# Patient Record
Sex: Female | Born: 1955 | Hispanic: Yes | Smoking: Never smoker
Health system: Southern US, Community
[De-identification: ages and names within clinical notes are randomized; demographics above are authoritative.]

## PROBLEM LIST (undated history)

## (undated) DIAGNOSIS — E041 Nontoxic single thyroid nodule: Secondary | ICD-10-CM

## (undated) DIAGNOSIS — E785 Hyperlipidemia, unspecified: Secondary | ICD-10-CM

## (undated) DIAGNOSIS — E079 Disorder of thyroid, unspecified: Secondary | ICD-10-CM

## (undated) HISTORY — DX: Nontoxic single thyroid nodule: E04.1

## (undated) HISTORY — DX: Hyperlipidemia, unspecified: E78.5

## (undated) HISTORY — DX: Disorder of thyroid, unspecified: E07.9

---

## 2002-09-27 HISTORY — PX: BREAST BIOPSY: SHX20

## 2005-12-21 ENCOUNTER — Ambulatory Visit: Payer: Self-pay

## 2007-02-22 ENCOUNTER — Ambulatory Visit: Payer: Self-pay | Admitting: Internal Medicine

## 2007-02-23 ENCOUNTER — Ambulatory Visit: Payer: Self-pay | Admitting: Internal Medicine

## 2007-03-03 ENCOUNTER — Ambulatory Visit: Payer: Self-pay | Admitting: Internal Medicine

## 2007-04-20 ENCOUNTER — Ambulatory Visit: Payer: Self-pay | Admitting: Internal Medicine

## 2007-10-09 ENCOUNTER — Ambulatory Visit: Payer: Self-pay | Admitting: Internal Medicine

## 2007-12-29 ENCOUNTER — Ambulatory Visit: Payer: Self-pay | Admitting: Internal Medicine

## 2008-02-27 ENCOUNTER — Ambulatory Visit: Payer: Self-pay | Admitting: Internal Medicine

## 2008-03-29 ENCOUNTER — Ambulatory Visit: Payer: Self-pay | Admitting: Internal Medicine

## 2008-05-29 ENCOUNTER — Ambulatory Visit: Payer: Self-pay | Admitting: Internal Medicine

## 2008-09-27 ENCOUNTER — Inpatient Hospital Stay: Payer: Self-pay | Admitting: Psychiatry

## 2008-10-05 ENCOUNTER — Ambulatory Visit: Payer: Self-pay | Admitting: Internal Medicine

## 2009-01-25 ENCOUNTER — Ambulatory Visit: Payer: Self-pay | Admitting: Internal Medicine

## 2009-04-02 ENCOUNTER — Ambulatory Visit: Payer: Self-pay | Admitting: Internal Medicine

## 2009-07-11 ENCOUNTER — Other Ambulatory Visit: Payer: Self-pay | Admitting: Internal Medicine

## 2009-12-08 ENCOUNTER — Other Ambulatory Visit: Payer: Self-pay | Admitting: Internal Medicine

## 2010-02-04 ENCOUNTER — Ambulatory Visit: Payer: Self-pay | Admitting: Internal Medicine

## 2010-02-13 ENCOUNTER — Ambulatory Visit: Payer: Self-pay | Admitting: General Practice

## 2010-04-07 ENCOUNTER — Ambulatory Visit: Payer: Self-pay

## 2010-05-16 ENCOUNTER — Emergency Department: Payer: Self-pay | Admitting: Emergency Medicine

## 2011-01-29 ENCOUNTER — Other Ambulatory Visit: Payer: Self-pay | Admitting: Internal Medicine

## 2011-04-20 ENCOUNTER — Other Ambulatory Visit: Payer: Self-pay | Admitting: Internal Medicine

## 2011-06-03 ENCOUNTER — Other Ambulatory Visit: Payer: Self-pay | Admitting: Internal Medicine

## 2011-06-17 ENCOUNTER — Other Ambulatory Visit: Payer: Self-pay | Admitting: Internal Medicine

## 2011-06-18 MED ORDER — LEVOTHYROXINE SODIUM 50 MCG PO TABS: 50.0000 ug | ORAL_TABLET | Freq: Every day | ORAL | Status: DC

## 2011-06-29 ENCOUNTER — Ambulatory Visit: Payer: Self-pay | Admitting: Internal Medicine

## 2011-09-27 ENCOUNTER — Other Ambulatory Visit: Payer: Self-pay | Admitting: General Practice

## 2011-12-06 DIAGNOSIS — E039 Hypothyroidism, unspecified: Secondary | ICD-10-CM | POA: Insufficient documentation

## 2011-12-06 DIAGNOSIS — E782 Mixed hyperlipidemia: Secondary | ICD-10-CM | POA: Insufficient documentation

## 2011-12-07 DIAGNOSIS — N6009 Solitary cyst of unspecified breast: Secondary | ICD-10-CM | POA: Insufficient documentation

## 2011-12-13 ENCOUNTER — Other Ambulatory Visit: Payer: Self-pay | Admitting: Internal Medicine

## 2011-12-13 ENCOUNTER — Ambulatory Visit: Payer: Self-pay | Admitting: Internal Medicine

## 2011-12-13 LAB — COMPREHENSIVE METABOLIC PANEL
Albumin: 3.6 g/dL (ref 3.4–5.0)
Alkaline Phosphatase: 84 U/L (ref 50–136)
Anion Gap: 11 (ref 7–16)
BUN: 14 mg/dL (ref 7–18)
Bilirubin,Total: 0.5 mg/dL (ref 0.2–1.0)
Calcium, Total: 8.9 mg/dL (ref 8.5–10.1)
Chloride: 104 mmol/L (ref 98–107)
Co2: 27 mmol/L (ref 21–32)
Creatinine: 0.73 mg/dL (ref 0.60–1.30)
EGFR (African American): >60
EGFR (Non-African Amer.): >60
Glucose: 92 mg/dL (ref 65–99)
Osmolality: 283 (ref 275–301)
Potassium: 3.7 mmol/L (ref 3.5–5.1)
SGOT(AST): 35 U/L (ref 15–37)
SGPT (ALT): 27 U/L
Sodium: 142 mmol/L (ref 136–145)
Total Protein: 7.4 g/dL (ref 6.4–8.2)

## 2011-12-13 LAB — TSH: Thyroid Stimulating Horm: 3.83 u[IU]/mL

## 2011-12-13 LAB — LIPID PANEL
Cholesterol: 185 mg/dL (ref 0–200)
HDL Cholesterol: 36 mg/dL — ABNORMAL LOW (ref 40–60)
Ldl Cholesterol, Calc: 116 mg/dL — ABNORMAL HIGH (ref 0–100)
Triglycerides: 163 mg/dL (ref 0–200)
VLDL Cholesterol, Calc: 33 mg/dL (ref 5–40)

## 2011-12-27 ENCOUNTER — Ambulatory Visit: Payer: Self-pay | Admitting: Surgery

## 2011-12-27 LAB — CBC WITH DIFFERENTIAL/PLATELET
Basophil #: 0 10*3/uL (ref 0.0–0.1)
Basophil %: 0.6 %
Eosinophil #: 0.1 10*3/uL (ref 0.0–0.7)
Eosinophil %: 1.4 %
HCT: 38 % (ref 35.0–47.0)
HGB: 12.9 g/dL (ref 12.0–16.0)
Lymphocyte #: 1.7 10*3/uL (ref 1.0–3.6)
Lymphocyte %: 34.3 %
MCH: 30.8 pg (ref 26.0–34.0)
MCHC: 34 g/dL (ref 32.0–36.0)
MCV: 91 fL (ref 80–100)
Monocyte #: 0.2 10*3/uL (ref 0.0–0.7)
Monocyte %: 4.5 %
Neutrophil #: 2.9 10*3/uL (ref 1.4–6.5)
Neutrophil %: 59.2 %
Platelet: 199 10*3/uL (ref 150–440)
RBC: 4.19 10*6/uL (ref 3.80–5.20)
RDW: 13.2 % (ref 11.5–14.5)
WBC: 4.9 10*3/uL (ref 3.6–11.0)

## 2012-01-03 ENCOUNTER — Ambulatory Visit: Payer: Self-pay | Admitting: Surgery

## 2012-01-03 HISTORY — PX: BREAST EXCISIONAL BIOPSY: SUR124

## 2012-01-06 LAB — PATHOLOGY REPORT

## 2012-06-14 ENCOUNTER — Ambulatory Visit: Payer: Self-pay | Admitting: Surgery

## 2012-06-14 LAB — CBC WITH DIFFERENTIAL/PLATELET
Basophil #: 0 10*3/uL (ref 0.0–0.1)
Basophil %: 0.4 %
Eosinophil #: 0.1 10*3/uL (ref 0.0–0.7)
Eosinophil %: 0.6 %
HCT: 37.4 % (ref 35.0–47.0)
HGB: 12.4 g/dL (ref 12.0–16.0)
Lymphocyte #: 1.7 10*3/uL (ref 1.0–3.6)
Lymphocyte %: 19.8 %
MCH: 29.7 pg (ref 26.0–34.0)
MCHC: 33 g/dL (ref 32.0–36.0)
MCV: 90 fL (ref 80–100)
Monocyte #: 0.3 x10 3/mm (ref 0.2–0.9)
Monocyte %: 3.7 %
Neutrophil #: 6.3 10*3/uL (ref 1.4–6.5)
Neutrophil %: 75.5 %
Platelet: 240 10*3/uL (ref 150–440)
RBC: 4.17 10*6/uL (ref 3.80–5.20)
RDW: 13.3 % (ref 11.5–14.5)
WBC: 8.4 10*3/uL (ref 3.6–11.0)

## 2012-06-14 LAB — BASIC METABOLIC PANEL
Anion Gap: 9 (ref 7–16)
BUN: 26 mg/dL — ABNORMAL HIGH (ref 7–18)
Calcium, Total: 8.7 mg/dL (ref 8.5–10.1)
Chloride: 105 mmol/L (ref 98–107)
Co2: 25 mmol/L (ref 21–32)
Creatinine: 1.14 mg/dL (ref 0.60–1.30)
EGFR (African American): >60
EGFR (Non-African Amer.): 53 — ABNORMAL LOW
Glucose: 104 mg/dL — ABNORMAL HIGH (ref 65–99)
Osmolality: 283 (ref 275–301)
Potassium: 3.8 mmol/L (ref 3.5–5.1)
Sodium: 139 mmol/L (ref 136–145)

## 2012-06-22 ENCOUNTER — Ambulatory Visit: Payer: Self-pay | Admitting: Surgery

## 2012-06-22 HISTORY — PX: BREAST EXCISIONAL BIOPSY: SUR124

## 2012-06-26 LAB — PATHOLOGY REPORT

## 2012-07-24 ENCOUNTER — Ambulatory Visit: Payer: Self-pay | Admitting: Internal Medicine

## 2012-07-24 DIAGNOSIS — IMO0001 Reserved for inherently not codable concepts without codable children: Secondary | ICD-10-CM | POA: Insufficient documentation

## 2012-07-24 LAB — CBC WITH DIFFERENTIAL/PLATELET
Basophil #: 0 10*3/uL (ref 0.0–0.1)
Basophil %: 0.4 %
Eosinophil #: 0.1 10*3/uL (ref 0.0–0.7)
Eosinophil %: 1.4 %
HCT: 39.2 % (ref 35.0–47.0)
HGB: 13.4 g/dL (ref 12.0–16.0)
Lymphocyte #: 2 10*3/uL (ref 1.0–3.6)
Lymphocyte %: 37.6 %
MCH: 30.5 pg (ref 26.0–34.0)
MCHC: 34.2 g/dL (ref 32.0–36.0)
MCV: 89 fL (ref 80–100)
Monocyte #: 0.2 x10 3/mm (ref 0.2–0.9)
Monocyte %: 4 %
Neutrophil #: 3.1 10*3/uL (ref 1.4–6.5)
Neutrophil %: 56.6 %
Platelet: 223 10*3/uL (ref 150–440)
RBC: 4.39 10*6/uL (ref 3.80–5.20)
RDW: 13 % (ref 11.5–14.5)
WBC: 5.4 10*3/uL (ref 3.6–11.0)

## 2012-07-24 LAB — COMPREHENSIVE METABOLIC PANEL
Albumin: 3.5 g/dL (ref 3.4–5.0)
Alkaline Phosphatase: 101 U/L (ref 50–136)
Anion Gap: 6 — ABNORMAL LOW (ref 7–16)
BUN: 19 mg/dL — ABNORMAL HIGH (ref 7–18)
Bilirubin,Total: 0.4 mg/dL (ref 0.2–1.0)
Calcium, Total: 8.7 mg/dL (ref 8.5–10.1)
Chloride: 108 mmol/L — ABNORMAL HIGH (ref 98–107)
Co2: 29 mmol/L (ref 21–32)
Creatinine: 0.72 mg/dL (ref 0.60–1.30)
EGFR (African American): >60
EGFR (Non-African Amer.): >60
Glucose: 86 mg/dL (ref 65–99)
Osmolality: 287 (ref 275–301)
Potassium: 3.8 mmol/L (ref 3.5–5.1)
SGOT(AST): 31 U/L (ref 15–37)
SGPT (ALT): 27 U/L (ref 12–78)
Sodium: 143 mmol/L (ref 136–145)
Total Protein: 7.2 g/dL (ref 6.4–8.2)

## 2012-08-30 ENCOUNTER — Ambulatory Visit: Payer: Self-pay | Admitting: General Practice

## 2012-09-09 ENCOUNTER — Ambulatory Visit: Payer: Self-pay | Admitting: Unknown Physician Specialty

## 2012-09-09 DIAGNOSIS — Z9889 Other specified postprocedural states: Secondary | ICD-10-CM | POA: Insufficient documentation

## 2012-09-13 LAB — PATHOLOGY REPORT

## 2012-09-21 HISTORY — PX: OTHER SURGICAL HISTORY: SHX169

## 2012-10-30 ENCOUNTER — Ambulatory Visit: Payer: Self-pay | Admitting: Surgery

## 2013-02-23 ENCOUNTER — Other Ambulatory Visit: Payer: Self-pay | Admitting: Family Medicine

## 2013-02-23 LAB — LIPID PANEL
Cholesterol: 149 mg/dL (ref 0–200)
HDL Cholesterol: 29 mg/dL — ABNORMAL LOW (ref 40–60)
Ldl Cholesterol, Calc: 82 mg/dL (ref 0–100)
Triglycerides: 188 mg/dL (ref 0–200)
VLDL Cholesterol, Calc: 38 mg/dL (ref 5–40)

## 2013-02-23 LAB — TSH: Thyroid Stimulating Horm: 6.6 u[IU]/mL — ABNORMAL HIGH

## 2013-05-28 ENCOUNTER — Other Ambulatory Visit: Payer: Self-pay | Admitting: Family Medicine

## 2013-05-28 LAB — TSH: Thyroid Stimulating Horm: 2.04 u[IU]/mL

## 2013-09-08 LAB — HM COLONOSCOPY

## 2013-10-15 ENCOUNTER — Ambulatory Visit: Payer: Self-pay | Admitting: Otolaryngology

## 2013-10-22 ENCOUNTER — Other Ambulatory Visit: Payer: Self-pay | Admitting: Family Medicine

## 2013-10-22 LAB — LIPID PANEL
Cholesterol: 170 mg/dL (ref 0–200)
HDL Cholesterol: 32 mg/dL — ABNORMAL LOW (ref 40–60)
Ldl Cholesterol, Calc: 111 mg/dL — ABNORMAL HIGH (ref 0–100)
Triglycerides: 136 mg/dL (ref 0–200)
VLDL Cholesterol, Calc: 27 mg/dL (ref 5–40)

## 2013-10-22 LAB — TSH: Thyroid Stimulating Horm: 2.59 u[IU]/mL

## 2013-11-05 ENCOUNTER — Ambulatory Visit: Payer: Self-pay | Admitting: Family Medicine

## 2013-11-30 ENCOUNTER — Ambulatory Visit: Payer: Self-pay | Admitting: Family Medicine

## 2013-12-31 ENCOUNTER — Ambulatory Visit: Payer: Self-pay | Admitting: Family Medicine

## 2013-12-31 ENCOUNTER — Ambulatory Visit: Payer: Self-pay

## 2014-01-25 ENCOUNTER — Ambulatory Visit: Payer: Self-pay

## 2014-09-02 DIAGNOSIS — M19049 Primary osteoarthritis, unspecified hand: Secondary | ICD-10-CM | POA: Insufficient documentation

## 2014-09-06 ENCOUNTER — Ambulatory Visit: Payer: Self-pay | Admitting: Family Medicine

## 2014-09-06 LAB — COMPREHENSIVE METABOLIC PANEL
Albumin: 3.2 g/dL — ABNORMAL LOW (ref 3.4–5.0)
Alkaline Phosphatase: 116 U/L
Anion Gap: 4 — ABNORMAL LOW (ref 7–16)
BUN: 18 mg/dL (ref 7–18)
Bilirubin,Total: 0.6 mg/dL (ref 0.2–1.0)
Calcium, Total: 8.3 mg/dL — ABNORMAL LOW (ref 8.5–10.1)
Chloride: 110 mmol/L — ABNORMAL HIGH (ref 98–107)
Co2: 28 mmol/L (ref 21–32)
Creatinine: 0.76 mg/dL (ref 0.60–1.30)
EGFR (African American): >60
EGFR (Non-African Amer.): >60
Glucose: 89 mg/dL (ref 65–99)
Osmolality: 284 (ref 275–301)
Potassium: 4 mmol/L (ref 3.5–5.1)
SGOT(AST): 26 U/L (ref 15–37)
SGPT (ALT): 28 U/L
Sodium: 142 mmol/L (ref 136–145)
Total Protein: 7 g/dL (ref 6.4–8.2)

## 2014-09-06 LAB — CBC WITH DIFFERENTIAL/PLATELET
Basophil #: 0 10*3/uL (ref 0.0–0.1)
Basophil %: 0.5 %
Eosinophil #: 0.1 10*3/uL (ref 0.0–0.7)
Eosinophil %: 1.4 %
HCT: 40.9 % (ref 35.0–47.0)
HGB: 13.2 g/dL (ref 12.0–16.0)
Lymphocyte #: 1.8 10*3/uL (ref 1.0–3.6)
Lymphocyte %: 32.3 %
MCH: 29.3 pg (ref 26.0–34.0)
MCHC: 32.2 g/dL (ref 32.0–36.0)
MCV: 91 fL (ref 80–100)
Monocyte #: 0.2 x10 3/mm (ref 0.2–0.9)
Monocyte %: 4.2 %
Neutrophil #: 3.5 10*3/uL (ref 1.4–6.5)
Neutrophil %: 61.6 %
Platelet: 228 10*3/uL (ref 150–440)
RBC: 4.49 10*6/uL (ref 3.80–5.20)
RDW: 14.5 % (ref 11.5–14.5)
WBC: 5.6 10*3/uL (ref 3.6–11.0)

## 2014-09-06 LAB — LIPID PANEL
Cholesterol: 159 mg/dL (ref 0–200)
HDL Cholesterol: 39 mg/dL — ABNORMAL LOW (ref 40–60)
Ldl Cholesterol, Calc: 94 mg/dL (ref 0–100)
Triglycerides: 129 mg/dL (ref 0–200)
VLDL Cholesterol, Calc: 26 mg/dL (ref 5–40)

## 2014-09-06 LAB — T4, FREE: Free Thyroxine: 0.71 ng/dL — ABNORMAL LOW (ref 0.76–1.46)

## 2014-09-06 LAB — HEMOGLOBIN A1C: Hemoglobin A1C: 5.7 % (ref 4.2–6.3)

## 2014-09-06 LAB — TSH: Thyroid Stimulating Horm: 2.1 u[IU]/mL

## 2014-12-18 ENCOUNTER — Ambulatory Visit: Payer: Self-pay | Admitting: Family Medicine

## 2015-01-14 NOTE — Op Note (Signed)
PATIENT NAME:  Ashley BasemanYUQUILIMA AGUDO, Har[place] G MR#:  [id number] DATE OF BIRTH:  04-Nov-1955  DATE OF PROCEDURE:  06/22/2012  PREOPERATIVE DIAGNOSIS: Phyllodes tumor of the right breast.   POSTOPERATIVE DIAGNOSIS: Phyllodes tumor of the right breast.   PROCEDURE: Re-excision of right breast mass.   SURGEON: Adella HareJ. Wilton Jeramie Scogin, MD  ANESTHESIA: General.   INDICATIONS: This 59 year old female had excision of a mass of the upper outer quadrant right breast which was a phyllodes tumor. There was some involvement of the margins. Re-excision was recommended for further treatment.   DESCRIPTION OF PROCEDURE: The patient was placed on the operating table in the supine position under general anesthesia. The right arm was placed on a lateral arm support. The right breast was prepared with ChloraPrep, draped in a sterile manner.   The old scar was identified in the upper outer quadrant of the right breast. The new incision was made to excise the scar with an ellipse of approximately 8 mm in width and the elliptical excision was carried out and dissected down through subcutaneous tissues palpating some scar tissue from her previous surgery and excised the tissue with use of electrocautery for hemostasis. A portion of tissue which was approximately 5 x 3 cm in dimension and extended down to the deep fascia was excised. The incision went from the 9:00 to 11:00 position. The 11:00 position was tagged with a stitch for the pathologist's orientation. There was no actual tumor identified in the cut margins. A portion of the tissue was the dense breast glandular tissue and was submitted in formalin for routine pathology. The wound was inspected Three clamped vessels were suture ligated with 3-0 chromic. Several other small bleeding points were cauterized. Hemostasis was subsequently intact. Tissues were infiltrated with 0.25% Sensorcaine with epinephrine and the subcutaneous tissues were closed with 3-0 chromic and then the skin was  closed with running 4-0 Monocryl subcuticular suture and Dermabond.    The patient tolerated surgery satisfactorily and was then prepared for transfer to the recovery room.  ____________________________ Shela CommonsJ. Renda RollsWilton Lavone Weisel, MD jws:cms D: 06/22/2012 08:48:27 ET T: 06/22/2012 10:46:59 ET JOB#: [id number]  cc: Adella HareJ. Wilton Kaid Seeberger, MD, <Dictator> Adella HareWILTON J Vanessa Kampf MD ELECTRONICALLY SIGNED 06/23/2012 9:45

## 2015-04-17 DIAGNOSIS — R7303 Prediabetes: Secondary | ICD-10-CM | POA: Insufficient documentation

## 2015-04-22 ENCOUNTER — Other Ambulatory Visit
Admission: RE | Admit: 2015-04-22 | Discharge: 2015-04-22 | Disposition: A | Discharge status: Home / Self Care | Payer: 59 | Source: Ambulatory Visit | Attending: Family Medicine | Admitting: Family Medicine

## 2015-04-22 DIAGNOSIS — M19042 Primary osteoarthritis, left hand: Secondary | ICD-10-CM | POA: Diagnosis present

## 2015-04-22 DIAGNOSIS — M19041 Primary osteoarthritis, right hand: Secondary | ICD-10-CM | POA: Insufficient documentation

## 2015-04-22 LAB — COMPREHENSIVE METABOLIC PANEL
ALT: 22 U/L (ref 14–54)
AST: 27 U/L (ref 15–41)
Albumin: 3.6 g/dL (ref 3.5–5.0)
Alkaline Phosphatase: 86 U/L (ref 38–126)
Anion gap: 5 (ref 5–15)
BUN: 20 mg/dL (ref 6–20)
CO2: 25 mmol/L (ref 22–32)
Calcium: 8.8 mg/dL — ABNORMAL LOW (ref 8.9–10.3)
Chloride: 108 mmol/L (ref 101–111)
Creatinine, Ser: 0.65 mg/dL (ref 0.44–1.00)
GFR calc Af Amer: >60 mL/min (ref >60)
GFR calc non Af Amer: >60 mL/min (ref >60)
Glucose, Bld: 100 mg/dL — ABNORMAL HIGH (ref 65–99)
Potassium: 3.8 mmol/L (ref 3.5–5.1)
Sodium: 138 mmol/L (ref 135–145)
Total Bilirubin: 0.6 mg/dL (ref 0.3–1.2)
Total Protein: 6.6 g/dL (ref 6.5–8.1)

## 2015-04-22 LAB — CBC WITH DIFFERENTIAL/PLATELET
Basophils Absolute: 0 10*3/uL (ref 0–0.1)
Basophils Relative: 1 %
Eosinophils Absolute: 0.1 10*3/uL (ref 0–0.7)
Eosinophils Relative: 2 %
HCT: 38.6 % (ref 35.0–47.0)
Hemoglobin: 13 g/dL (ref 12.0–16.0)
Lymphocytes Relative: 34 %
Lymphs Abs: 1.5 10*3/uL (ref 1.0–3.6)
MCH: 29.9 pg (ref 26.0–34.0)
MCHC: 33.7 g/dL (ref 32.0–36.0)
MCV: 88.9 fL (ref 80.0–100.0)
Monocytes Absolute: 0.2 10*3/uL (ref 0.2–0.9)
Monocytes Relative: 4 %
Neutro Abs: 2.6 10*3/uL (ref 1.4–6.5)
Neutrophils Relative %: 59 %
Platelets: 216 10*3/uL (ref 150–440)
RBC: 4.35 MIL/uL (ref 3.80–5.20)
RDW: 13 % (ref 11.5–14.5)
WBC: 4.4 10*3/uL (ref 3.6–11.0)

## 2015-04-22 LAB — C-REACTIVE PROTEIN: CRP: <0.5 mg/dL (ref <1.0)

## 2015-04-22 LAB — SEDIMENTATION RATE: Sed Rate: 16 mm/hr (ref 0–22)

## 2015-04-23 LAB — RHEUMATOID FACTOR: Rhuematoid fact SerPl-aCnc: 9.5 IU/mL (ref 0.0–13.9)

## 2015-05-30 ENCOUNTER — Other Ambulatory Visit
Admission: RE | Admit: 2015-05-30 | Discharge: 2015-05-30 | Disposition: A | Discharge status: Home / Self Care | Payer: 59 | Source: Ambulatory Visit | Attending: Family Medicine | Admitting: Family Medicine

## 2015-05-30 DIAGNOSIS — E782 Mixed hyperlipidemia: Secondary | ICD-10-CM | POA: Insufficient documentation

## 2015-05-30 LAB — HEMOGLOBIN A1C: Hgb A1c MFr Bld: 5.4 % (ref 4.0–6.0)

## 2015-05-30 LAB — LIPID PANEL
Cholesterol: 175 mg/dL (ref 0–200)
HDL: 29 mg/dL — ABNORMAL LOW (ref >40)
LDL Cholesterol: 89 mg/dL (ref 0–99)
Total CHOL/HDL Ratio: 6 RATIO
Triglycerides: 284 mg/dL — ABNORMAL HIGH (ref <150)
VLDL: 57 mg/dL — ABNORMAL HIGH (ref 0–40)

## 2015-05-30 LAB — TSH: TSH: 3.1 u[IU]/mL (ref 0.350–4.500)

## 2015-05-30 LAB — T4, FREE: Free T4: 0.69 ng/dL (ref 0.61–1.12)

## 2015-08-13 ENCOUNTER — Encounter: Payer: Self-pay | Admitting: Physician Assistant

## 2015-08-13 ENCOUNTER — Ambulatory Visit: Payer: Self-pay | Admitting: Physician Assistant

## 2015-08-13 VITALS — BP 100/70 | HR 78 | Temp 98.1°F

## 2015-08-13 DIAGNOSIS — J309 Allergic rhinitis, unspecified: Secondary | ICD-10-CM

## 2015-08-13 MED ORDER — FLUTICASONE PROPIONATE 50 MCG/ACT NA SUSP: 2.0000 | Freq: Every day | NASAL | Status: DC

## 2015-08-13 NOTE — Progress Notes (Signed)
S/ has had " cold " sxs for a week , sxs worse after inhaling [employer] [facility] [employer]  4 days ago  itching of throat , hoarse, congested , mucous clear   no fever or body aches  O/ VSS alert pleasant NAD ENT with allergic changes to mucosa and boggy turbinates  Pharynx clear neck supple heart rsr lungs clear  A/ allergic rhinitis P claritin otc. Nasal saline bid and prn. Rx flonase

## 2015-11-28 DIAGNOSIS — Z0000 Encounter for general adult medical examination without abnormal findings: Secondary | ICD-10-CM | POA: Diagnosis not present

## 2015-11-28 DIAGNOSIS — Z23 Encounter for immunization: Secondary | ICD-10-CM | POA: Diagnosis not present

## 2015-11-28 DIAGNOSIS — E782 Mixed hyperlipidemia: Secondary | ICD-10-CM | POA: Diagnosis not present

## 2015-11-28 DIAGNOSIS — E039 Hypothyroidism, unspecified: Secondary | ICD-10-CM | POA: Diagnosis not present

## 2015-12-02 ENCOUNTER — Other Ambulatory Visit
Admission: RE | Admit: 2015-12-02 | Discharge: 2015-12-02 | Disposition: A | Discharge status: Home / Self Care | Payer: 59 | Source: Ambulatory Visit | Attending: Family Medicine | Admitting: Family Medicine

## 2015-12-02 DIAGNOSIS — E039 Hypothyroidism, unspecified: Secondary | ICD-10-CM | POA: Diagnosis not present

## 2015-12-02 DIAGNOSIS — Z0000 Encounter for general adult medical examination without abnormal findings: Secondary | ICD-10-CM | POA: Diagnosis not present

## 2015-12-02 DIAGNOSIS — E782 Mixed hyperlipidemia: Secondary | ICD-10-CM | POA: Diagnosis not present

## 2015-12-02 LAB — TSH: TSH: 2.637 u[IU]/mL (ref 0.350–4.500)

## 2015-12-02 LAB — COMPREHENSIVE METABOLIC PANEL
ALT: 22 U/L (ref 14–54)
AST: 28 U/L (ref 15–41)
Albumin: 3.7 g/dL (ref 3.5–5.0)
Alkaline Phosphatase: 88 U/L (ref 38–126)
Anion gap: 4 — ABNORMAL LOW (ref 5–15)
BUN: 20 mg/dL (ref 6–20)
CO2: 28 mmol/L (ref 22–32)
Calcium: 9.3 mg/dL (ref 8.9–10.3)
Chloride: 111 mmol/L (ref 101–111)
Creatinine, Ser: 0.73 mg/dL (ref 0.44–1.00)
GFR calc Af Amer: >60 mL/min (ref >60)
GFR calc non Af Amer: >60 mL/min (ref >60)
Glucose, Bld: 90 mg/dL (ref 65–99)
Potassium: 4.1 mmol/L (ref 3.5–5.1)
Sodium: 143 mmol/L (ref 135–145)
Total Bilirubin: 0.6 mg/dL (ref 0.3–1.2)
Total Protein: 6.6 g/dL (ref 6.5–8.1)

## 2015-12-02 LAB — CBC WITH DIFFERENTIAL/PLATELET
Basophils Absolute: 0 10*3/uL (ref 0–0.1)
Basophils Relative: 0 %
Eosinophils Absolute: 0 10*3/uL (ref 0–0.7)
Eosinophils Relative: 1 %
HCT: 37.8 % (ref 35.0–47.0)
Hemoglobin: 13 g/dL (ref 12.0–16.0)
Lymphocytes Relative: 31 %
Lymphs Abs: 1.5 10*3/uL (ref 1.0–3.6)
MCH: 30.1 pg (ref 26.0–34.0)
MCHC: 34.3 g/dL (ref 32.0–36.0)
MCV: 87.7 fL (ref 80.0–100.0)
Monocytes Absolute: 0.2 10*3/uL (ref 0.2–0.9)
Monocytes Relative: 5 %
Neutro Abs: 3 10*3/uL (ref 1.4–6.5)
Neutrophils Relative %: 63 %
Platelets: 216 10*3/uL (ref 150–440)
RBC: 4.3 MIL/uL (ref 3.80–5.20)
RDW: 13.4 % (ref 11.5–14.5)
WBC: 4.8 10*3/uL (ref 3.6–11.0)

## 2015-12-02 LAB — LIPID PANEL
Cholesterol: 181 mg/dL (ref 0–200)
HDL: 39 mg/dL — ABNORMAL LOW (ref >40)
LDL Cholesterol: 117 mg/dL — ABNORMAL HIGH (ref 0–99)
Total CHOL/HDL Ratio: 4.6 RATIO
Triglycerides: 124 mg/dL (ref <150)
VLDL: 25 mg/dL (ref 0–40)

## 2015-12-02 LAB — HEMOGLOBIN A1C: Hgb A1c MFr Bld: 5.3 % (ref 4.0–6.0)

## 2015-12-04 ENCOUNTER — Other Ambulatory Visit: Payer: Self-pay | Admitting: Family Medicine

## 2015-12-04 DIAGNOSIS — Z1231 Encounter for screening mammogram for malignant neoplasm of breast: Secondary | ICD-10-CM

## 2015-12-22 ENCOUNTER — Ambulatory Visit
Admission: RE | Admit: 2015-12-22 | Discharge: 2015-12-22 | Disposition: A | Discharge status: Home / Self Care | Payer: 59 | Source: Ambulatory Visit | Attending: Family Medicine | Admitting: Family Medicine

## 2015-12-22 ENCOUNTER — Other Ambulatory Visit: Payer: Self-pay | Admitting: Family Medicine

## 2015-12-22 DIAGNOSIS — Z1231 Encounter for screening mammogram for malignant neoplasm of breast: Secondary | ICD-10-CM

## 2016-02-05 ENCOUNTER — Encounter: Payer: Self-pay | Admitting: Physician Assistant

## 2016-02-05 ENCOUNTER — Ambulatory Visit: Payer: Self-pay | Admitting: Physician Assistant

## 2016-02-05 VITALS — BP 110/60 | HR 68 | Temp 97.7°F

## 2016-02-05 DIAGNOSIS — M1990 Unspecified osteoarthritis, unspecified site: Secondary | ICD-10-CM

## 2016-02-05 DIAGNOSIS — R1084 Generalized abdominal pain: Secondary | ICD-10-CM

## 2016-02-05 LAB — POCT URINALYSIS DIPSTICK
Bilirubin, UA: NEGATIVE
Blood, UA: NEGATIVE
Glucose, UA: NEGATIVE
Ketones, UA: NEGATIVE
Leukocytes, UA: NEGATIVE
Nitrite, UA: NEGATIVE
Protein, UA: NEGATIVE
Spec Grav, UA: 1.01
Urobilinogen, UA: 0.2
pH, UA: 7

## 2016-02-05 MED ORDER — MELOXICAM 15 MG PO TABS: 15.0000 mg | ORAL_TABLET | Freq: Every day | ORAL | Status: DC

## 2016-02-05 NOTE — Progress Notes (Signed)
S: c/o abdominal pain and joint pain, states she hasn't felt like eating, no v/d/constipation, thinks she has a fever; no cold sx or body aches, also hands have arthritis and she is really hurting in her joints, out of medication to help  O: vitals wnl, nad, skin clammy, lungs c t a, cv rrr, abd soft mild diffuse tenderness, joints with arthritis at dips of both hands, full rom, n/v intact, ua wnl  A: viral illness, arthritis  P: mobic for arthritis, work note, reassurance , go to ER if worsening

## 2016-03-01 ENCOUNTER — Ambulatory Visit
Admission: RE | Admit: 2016-03-01 | Discharge: 2016-03-01 | Disposition: A | Discharge status: Home / Self Care | Payer: 59 | Source: Ambulatory Visit | Attending: Rheumatology | Admitting: Rheumatology

## 2016-03-01 ENCOUNTER — Other Ambulatory Visit: Payer: Self-pay | Admitting: Rheumatology

## 2016-03-01 DIAGNOSIS — M545 Low back pain, unspecified: Secondary | ICD-10-CM

## 2016-03-01 DIAGNOSIS — M4316 Spondylolisthesis, lumbar region: Secondary | ICD-10-CM | POA: Insufficient documentation

## 2016-03-01 DIAGNOSIS — G8929 Other chronic pain: Secondary | ICD-10-CM | POA: Diagnosis not present

## 2016-03-01 DIAGNOSIS — M4306 Spondylolysis, lumbar region: Secondary | ICD-10-CM | POA: Diagnosis not present

## 2016-03-01 DIAGNOSIS — M150 Primary generalized (osteo)arthritis: Secondary | ICD-10-CM | POA: Diagnosis not present

## 2016-05-28 ENCOUNTER — Other Ambulatory Visit
Admission: RE | Admit: 2016-05-28 | Discharge: 2016-05-28 | Disposition: A | Discharge status: Home / Self Care | Payer: 59 | Source: Ambulatory Visit | Attending: Family Medicine | Admitting: Family Medicine

## 2016-05-28 DIAGNOSIS — E039 Hypothyroidism, unspecified: Secondary | ICD-10-CM | POA: Diagnosis not present

## 2016-05-28 LAB — TSH: TSH: 2.482 u[IU]/mL (ref 0.350–4.500)

## 2016-05-28 LAB — COMPREHENSIVE METABOLIC PANEL
ALT: 39 U/L (ref 14–54)
AST: 35 U/L (ref 15–41)
Albumin: 3.6 g/dL (ref 3.5–5.0)
Alkaline Phosphatase: 95 U/L (ref 38–126)
Anion gap: 2 — ABNORMAL LOW (ref 5–15)
BUN: 23 mg/dL — ABNORMAL HIGH (ref 6–20)
CO2: 27 mmol/L (ref 22–32)
Calcium: 9.2 mg/dL (ref 8.9–10.3)
Chloride: 109 mmol/L (ref 101–111)
Creatinine, Ser: 0.67 mg/dL (ref 0.44–1.00)
GFR calc Af Amer: >60 mL/min (ref >60)
GFR calc non Af Amer: >60 mL/min (ref >60)
Glucose, Bld: 94 mg/dL (ref 65–99)
Potassium: 4.1 mmol/L (ref 3.5–5.1)
Sodium: 138 mmol/L (ref 135–145)
Total Bilirubin: 0.7 mg/dL (ref 0.3–1.2)
Total Protein: 6.5 g/dL (ref 6.5–8.1)

## 2016-05-28 LAB — LIPID PANEL
Cholesterol: 240 mg/dL — ABNORMAL HIGH (ref 0–200)
HDL: 45 mg/dL (ref >40)
LDL Cholesterol: 174 mg/dL — ABNORMAL HIGH (ref 0–99)
Total CHOL/HDL Ratio: 5.3 RATIO
Triglycerides: 106 mg/dL (ref <150)
VLDL: 21 mg/dL (ref 0–40)

## 2016-06-03 DIAGNOSIS — E039 Hypothyroidism, unspecified: Secondary | ICD-10-CM | POA: Diagnosis not present

## 2016-06-03 DIAGNOSIS — E7800 Pure hypercholesterolemia, unspecified: Secondary | ICD-10-CM | POA: Diagnosis not present

## 2016-06-03 DIAGNOSIS — K146 Glossodynia: Secondary | ICD-10-CM | POA: Diagnosis not present

## 2016-08-24 DIAGNOSIS — K146 Glossodynia: Secondary | ICD-10-CM | POA: Diagnosis not present

## 2016-08-24 DIAGNOSIS — R1013 Epigastric pain: Secondary | ICD-10-CM | POA: Diagnosis not present

## 2016-09-03 DIAGNOSIS — H52223 Regular astigmatism, bilateral: Secondary | ICD-10-CM | POA: Diagnosis not present

## 2016-09-03 DIAGNOSIS — H5203 Hypermetropia, bilateral: Secondary | ICD-10-CM | POA: Diagnosis not present

## 2016-09-03 DIAGNOSIS — H524 Presbyopia: Secondary | ICD-10-CM | POA: Diagnosis not present

## 2016-09-23 DIAGNOSIS — R748 Abnormal levels of other serum enzymes: Secondary | ICD-10-CM | POA: Diagnosis not present

## 2016-10-01 ENCOUNTER — Other Ambulatory Visit
Admission: RE | Admit: 2016-10-01 | Discharge: 2016-10-01 | Disposition: A | Discharge status: Home / Self Care | Payer: 59 | Source: Ambulatory Visit | Attending: Family Medicine | Admitting: Family Medicine

## 2016-10-01 DIAGNOSIS — E7800 Pure hypercholesterolemia, unspecified: Secondary | ICD-10-CM | POA: Diagnosis not present

## 2016-10-01 LAB — LIPID PANEL
Cholesterol: 219 mg/dL — ABNORMAL HIGH (ref 0–200)
HDL: 43 mg/dL (ref >40)
LDL Cholesterol: 143 mg/dL — ABNORMAL HIGH (ref 0–99)
Total CHOL/HDL Ratio: 5.1 RATIO
Triglycerides: 167 mg/dL — ABNORMAL HIGH (ref <150)
VLDL: 33 mg/dL (ref 0–40)

## 2016-10-22 ENCOUNTER — Other Ambulatory Visit
Admission: RE | Admit: 2016-10-22 | Discharge: 2016-10-22 | Disposition: A | Discharge status: Home / Self Care | Payer: 59 | Source: Ambulatory Visit | Attending: Family Medicine | Admitting: Family Medicine

## 2016-10-22 DIAGNOSIS — E039 Hypothyroidism, unspecified: Secondary | ICD-10-CM | POA: Insufficient documentation

## 2016-10-23 LAB — THYROID PANEL WITH TSH
Free Thyroxine Index: 1.5 (ref 1.2–4.9)
T3 Uptake Ratio: 26 % (ref 24–39)
T4, Total: 5.7 ug/dL (ref 4.5–12.0)
TSH: 3.97 u[IU]/mL (ref 0.450–4.500)

## 2016-11-04 DIAGNOSIS — K146 Glossodynia: Secondary | ICD-10-CM | POA: Diagnosis not present

## 2016-11-06 DIAGNOSIS — K146 Glossodynia: Secondary | ICD-10-CM | POA: Insufficient documentation

## 2016-12-13 DIAGNOSIS — E669 Obesity, unspecified: Secondary | ICD-10-CM | POA: Insufficient documentation

## 2016-12-13 DIAGNOSIS — E7800 Pure hypercholesterolemia, unspecified: Secondary | ICD-10-CM | POA: Diagnosis not present

## 2016-12-13 DIAGNOSIS — K146 Glossodynia: Secondary | ICD-10-CM | POA: Diagnosis not present

## 2016-12-13 DIAGNOSIS — E039 Hypothyroidism, unspecified: Secondary | ICD-10-CM | POA: Diagnosis not present

## 2017-01-20 ENCOUNTER — Other Ambulatory Visit: Payer: Self-pay | Admitting: Family Medicine

## 2017-01-20 DIAGNOSIS — Z1231 Encounter for screening mammogram for malignant neoplasm of breast: Secondary | ICD-10-CM

## 2017-01-24 ENCOUNTER — Ambulatory Visit
Admission: RE | Admit: 2017-01-24 | Discharge: 2017-01-24 | Disposition: A | Discharge status: Home / Self Care | Payer: 59 | Source: Ambulatory Visit | Attending: Family Medicine | Admitting: Family Medicine

## 2017-01-24 DIAGNOSIS — Z1231 Encounter for screening mammogram for malignant neoplasm of breast: Secondary | ICD-10-CM | POA: Insufficient documentation

## 2017-01-28 ENCOUNTER — Other Ambulatory Visit
Admission: RE | Admit: 2017-01-28 | Discharge: 2017-01-28 | Disposition: A | Discharge status: Home / Self Care | Payer: 59 | Source: Ambulatory Visit | Attending: Family Medicine | Admitting: Family Medicine

## 2017-01-28 DIAGNOSIS — E039 Hypothyroidism, unspecified: Secondary | ICD-10-CM | POA: Insufficient documentation

## 2017-01-28 DIAGNOSIS — E7800 Pure hypercholesterolemia, unspecified: Secondary | ICD-10-CM | POA: Insufficient documentation

## 2017-01-28 LAB — LIPID PANEL
Cholesterol: 176 mg/dL (ref 0–200)
HDL: 39 mg/dL — ABNORMAL LOW (ref >40)
LDL Cholesterol: 114 mg/dL — ABNORMAL HIGH (ref 0–99)
Total CHOL/HDL Ratio: 4.5 RATIO
Triglycerides: 117 mg/dL (ref <150)
VLDL: 23 mg/dL (ref 0–40)

## 2017-01-28 LAB — TSH: TSH: 2.856 u[IU]/mL (ref 0.350–4.500)

## 2017-02-02 ENCOUNTER — Other Ambulatory Visit
Admission: RE | Admit: 2017-02-02 | Discharge: 2017-02-02 | Disposition: A | Discharge status: Home / Self Care | Payer: 59 | Source: Ambulatory Visit | Attending: Nurse Practitioner | Admitting: Nurse Practitioner

## 2017-02-02 DIAGNOSIS — R748 Abnormal levels of other serum enzymes: Secondary | ICD-10-CM | POA: Diagnosis not present

## 2017-02-02 LAB — HEPATIC FUNCTION PANEL
ALT: 30 U/L (ref 14–54)
AST: 32 U/L (ref 15–41)
Albumin: 3.8 g/dL (ref 3.5–5.0)
Alkaline Phosphatase: 114 U/L (ref 38–126)
Bilirubin, Direct: <0.1 mg/dL — ABNORMAL LOW (ref 0.1–0.5)
Total Bilirubin: 0.7 mg/dL (ref 0.3–1.2)
Total Protein: 6.8 g/dL (ref 6.5–8.1)

## 2017-05-27 ENCOUNTER — Other Ambulatory Visit
Admission: RE | Admit: 2017-05-27 | Discharge: 2017-05-27 | Disposition: A | Discharge status: Home / Self Care | Payer: 59 | Source: Ambulatory Visit | Attending: Family Medicine | Admitting: Family Medicine

## 2017-05-27 DIAGNOSIS — Z0000 Encounter for general adult medical examination without abnormal findings: Secondary | ICD-10-CM | POA: Diagnosis not present

## 2017-05-27 LAB — COMPREHENSIVE METABOLIC PANEL
ALT: 26 U/L (ref 14–54)
AST: 29 U/L (ref 15–41)
Albumin: 3.4 g/dL — ABNORMAL LOW (ref 3.5–5.0)
Alkaline Phosphatase: 93 U/L (ref 38–126)
Anion gap: 5 (ref 5–15)
BUN: 22 mg/dL — ABNORMAL HIGH (ref 6–20)
CO2: 25 mmol/L (ref 22–32)
Calcium: 9.4 mg/dL (ref 8.9–10.3)
Chloride: 111 mmol/L (ref 101–111)
Creatinine, Ser: 0.71 mg/dL (ref 0.44–1.00)
GFR calc Af Amer: >60 mL/min (ref >60)
GFR calc non Af Amer: >60 mL/min (ref >60)
Glucose, Bld: 99 mg/dL (ref 65–99)
Potassium: 4 mmol/L (ref 3.5–5.1)
Sodium: 141 mmol/L (ref 135–145)
Total Bilirubin: 0.7 mg/dL (ref 0.3–1.2)
Total Protein: 6.3 g/dL — ABNORMAL LOW (ref 6.5–8.1)

## 2017-05-27 LAB — CBC WITH DIFFERENTIAL/PLATELET
Basophils Absolute: 0 10*3/uL (ref 0–0.1)
Basophils Relative: 0 %
Eosinophils Absolute: 0.1 10*3/uL (ref 0–0.7)
Eosinophils Relative: 2 %
HCT: 38 % (ref 35.0–47.0)
Hemoglobin: 13.1 g/dL (ref 12.0–16.0)
Lymphocytes Relative: 34 %
Lymphs Abs: 1.7 10*3/uL (ref 1.0–3.6)
MCH: 30.2 pg (ref 26.0–34.0)
MCHC: 34.5 g/dL (ref 32.0–36.0)
MCV: 87.6 fL (ref 80.0–100.0)
Monocytes Absolute: 0.3 10*3/uL (ref 0.2–0.9)
Monocytes Relative: 6 %
Neutro Abs: 2.9 10*3/uL (ref 1.4–6.5)
Neutrophils Relative %: 58 %
Platelets: 220 10*3/uL (ref 150–440)
RBC: 4.34 MIL/uL (ref 3.80–5.20)
RDW: 13.6 % (ref 11.5–14.5)
WBC: 5 10*3/uL (ref 3.6–11.0)

## 2017-05-27 LAB — HEMOGLOBIN A1C
Hgb A1c MFr Bld: 5.4 % (ref 4.8–5.6)
Mean Plasma Glucose: 108.28 mg/dL

## 2017-08-09 ENCOUNTER — Other Ambulatory Visit: Payer: Self-pay | Admitting: Physician Assistant

## 2017-08-09 ENCOUNTER — Encounter: Payer: Self-pay | Admitting: Physician Assistant

## 2017-08-09 ENCOUNTER — Ambulatory Visit: Payer: Self-pay | Admitting: Physician Assistant

## 2017-08-09 VITALS — BP 118/80 | HR 61 | Temp 98.1°F

## 2017-08-09 DIAGNOSIS — H60313 Diffuse otitis externa, bilateral: Secondary | ICD-10-CM

## 2017-08-09 MED ORDER — NEOMYCIN-COLIST-HC-THONZONIUM 3.3-3-10-0.5 MG/ML OT SUSP: 4.0000 [drp] | Freq: Four times a day (QID) | OTIC | 0 refills | Status: DC

## 2017-08-09 MED ORDER — FEXOFENADINE-PSEUDOEPHED ER 60-120 MG PO TB12: 1.0000 | ORAL_TABLET | Freq: Two times a day (BID) | ORAL | 0 refills | Status: DC

## 2017-08-09 NOTE — Progress Notes (Signed)
   Subjective: Ear pain     Patient ID: Ashley Goodman, female    DOB: 1956-07-02, 61 y.o.   MRN: [id number]  HPI Patient presents with bilateral ear pain for 1 week. Patient states mild hearing loss. Patient denies URI signs symptoms. No palliative measures for complaint.   Review of Systems Hypothyroidism    Objective:   Physical Exam HEENT is remarkable for bilateral edematous ear canals. Scar tissue is noted on the right TM. Neck is supple with without adenopathy. Lungs CTA heart is regular rate and rhythm.       Assessment & Plan: Otitis external   Patient given discharge care instructions. Patient get a prescription for Cortisporin and fexofenadine. Patient advised follow-up PCP if no improvement in one week.

## 2017-09-26 DIAGNOSIS — E7800 Pure hypercholesterolemia, unspecified: Secondary | ICD-10-CM | POA: Diagnosis not present

## 2017-09-26 DIAGNOSIS — E039 Hypothyroidism, unspecified: Secondary | ICD-10-CM | POA: Diagnosis not present

## 2017-09-26 DIAGNOSIS — J069 Acute upper respiratory infection, unspecified: Secondary | ICD-10-CM | POA: Diagnosis not present

## 2017-10-15 ENCOUNTER — Other Ambulatory Visit
Admission: RE | Admit: 2017-10-15 | Discharge: 2017-10-15 | Disposition: A | Discharge status: Home / Self Care | Payer: 59 | Source: Ambulatory Visit | Attending: Family Medicine | Admitting: Family Medicine

## 2017-10-15 DIAGNOSIS — E7800 Pure hypercholesterolemia, unspecified: Secondary | ICD-10-CM | POA: Diagnosis not present

## 2017-10-15 DIAGNOSIS — E039 Hypothyroidism, unspecified: Secondary | ICD-10-CM | POA: Insufficient documentation

## 2017-10-15 LAB — LIPID PANEL
Cholesterol: 203 mg/dL — ABNORMAL HIGH (ref 0–200)
HDL: 41 mg/dL (ref >40)
LDL Cholesterol: 130 mg/dL — ABNORMAL HIGH (ref 0–99)
Total CHOL/HDL Ratio: 5 RATIO
Triglycerides: 162 mg/dL — ABNORMAL HIGH (ref <150)
VLDL: 32 mg/dL (ref 0–40)

## 2017-10-15 LAB — TSH: TSH: 3.027 u[IU]/mL (ref 0.350–4.500)

## 2017-10-24 DIAGNOSIS — E039 Hypothyroidism, unspecified: Secondary | ICD-10-CM | POA: Diagnosis not present

## 2017-10-24 DIAGNOSIS — Z0000 Encounter for general adult medical examination without abnormal findings: Secondary | ICD-10-CM | POA: Diagnosis not present

## 2017-10-24 DIAGNOSIS — E7800 Pure hypercholesterolemia, unspecified: Secondary | ICD-10-CM | POA: Insufficient documentation

## 2017-10-24 DIAGNOSIS — E669 Obesity, unspecified: Secondary | ICD-10-CM | POA: Diagnosis not present

## 2018-01-17 ENCOUNTER — Other Ambulatory Visit: Payer: Self-pay | Admitting: Family Medicine

## 2018-01-18 ENCOUNTER — Other Ambulatory Visit: Payer: Self-pay | Admitting: Family Medicine

## 2018-01-18 DIAGNOSIS — Z1231 Encounter for screening mammogram for malignant neoplasm of breast: Secondary | ICD-10-CM

## 2018-02-06 ENCOUNTER — Ambulatory Visit
Admission: RE | Admit: 2018-02-06 | Discharge: 2018-02-06 | Disposition: A | Discharge status: Home / Self Care | Payer: 59 | Source: Ambulatory Visit | Attending: Family Medicine | Admitting: Family Medicine

## 2018-02-06 DIAGNOSIS — Z1231 Encounter for screening mammogram for malignant neoplasm of breast: Secondary | ICD-10-CM | POA: Insufficient documentation

## 2018-03-23 DIAGNOSIS — H5203 Hypermetropia, bilateral: Secondary | ICD-10-CM | POA: Diagnosis not present

## 2018-03-23 DIAGNOSIS — H524 Presbyopia: Secondary | ICD-10-CM | POA: Diagnosis not present

## 2018-03-23 DIAGNOSIS — H52223 Regular astigmatism, bilateral: Secondary | ICD-10-CM | POA: Diagnosis not present

## 2018-03-23 DIAGNOSIS — H2513 Age-related nuclear cataract, bilateral: Secondary | ICD-10-CM | POA: Diagnosis not present

## 2018-05-16 ENCOUNTER — Ambulatory Visit (INDEPENDENT_AMBULATORY_CARE_PROVIDER_SITE_OTHER): Payer: Self-pay | Admitting: Family Medicine

## 2018-05-16 ENCOUNTER — Encounter: Payer: Self-pay | Admitting: Family Medicine

## 2018-05-16 VITALS — BP 110/70 | HR 64 | Temp 98.1°F | Wt 154.0 lb

## 2018-05-16 DIAGNOSIS — M542 Cervicalgia: Secondary | ICD-10-CM

## 2018-05-16 NOTE — Progress Notes (Signed)
Patient ID: Ashley Goodman, female    DOB: 1955/10/17, 62 y.o.   MRN: [id number]  PCP: Marisue IvanLinthavong, Kanhka, MD  Chief Complaint  Patient presents with  . Neck Pain    Subjective:  HPI Ashley Goodman is a 62 y.o. female presents for evaluation of neck pain. Concern issue is related to thyroid. She suffers from hypothyroidism. Last TSH panel normal. She complains of pain around the area of her thyroid. No fever. No URI symptoms, trouble swallowing, or problems with food stuck in throat. Social History   Socioeconomic History  . Marital status: Divorced    Spouse name: Not on file  . Number of children: Not on file  . Years of education: Not on file  . Highest education level: Not on file  Occupational History  . Not on file  Social Needs  . Financial resource strain: Not on file  . Food insecurity:    Worry: Not on file    Inability: Not on file  . Transportation needs:    Medical: Not on file    Non-medical: Not on file  Tobacco Use  . Smoking status: Never Smoker  . Smokeless tobacco: Never Used  Substance and Sexual Activity  . Alcohol use: Not on file  . Drug use: Not on file  . Sexual activity: Not on file  Lifestyle  . Physical activity:    Days per week: Not on file    Minutes per session: Not on file  . Stress: Not on file  Relationships  . Social connections:    Talks on phone: Not on file    Gets together: Not on file    Attends religious service: Not on file    Active member of club or organization: Not on file    Attends meetings of clubs or organizations: Not on file    Relationship status: Not on file  . Intimate partner violence:    Fear of current or ex partner: Not on file    Emotionally abused: Not on file    Physically abused: Not on file    Forced sexual activity: Not on file  Other Topics Concern  . Not on file  Social History Narrative  . Not on file    Family History  Problem Relation Age of Onset   . Breast cancer Neg Hx      Review of Systems Pertinent negatives listed in HPI  Patient Active Problem List   Diagnosis Date Noted  . Borderline diabetes mellitus 04/17/2015  . 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 History of biliary T-tube placement 09/09/2012  . Can't get food down 07/24/2012  . Breast cyst 12/07/2011  . Acquired hypothyroidism 12/06/2011  . Combined fat and carbohydrate induced hyperlipemia 12/06/2011    No Known Allergies  Prior to Admission medications   Medication Sig Start Date End Date Taking? Authorizing Provider  levothyroxine (SYNTHROID, LEVOTHROID) 112 MCG tablet Take 112 mcg by mouth daily before breakfast.   Yes [provider]  fexofenadine-pseudoephedrine (ALLEGRA-D) 60-120 MG 12 hr tablet Take 1 tablet 2 (two) times daily by mouth. Patient not taking: Reported on 05/16/2018 08/09/17   Joni ReiningSmith, Ronald K, PA-C  meloxicam (MOBIC) 15 MG tablet Take 1 tablet (15 mg total) by mouth daily. Patient not taking: Reported on 08/09/2017 02/05/16   Faythe GheeFisher, Susan W, PA-C    Past Medical, Surgical Family and Social History reviewed and updated.    Objective:   Today's Vitals   05/16/18 1604  BP: 110/70  Pulse: 64  Temp: 98.1 F (36.7 C)  SpO2: 96%  Weight: 154 lb (69.9 kg)    Wt Readings from Last 3 Encounters:  05/16/18 154 lb (69.9 kg)   Physical Exam  HENT:  Mouth/Throat: Oropharynx is clear and moist. No oropharyngeal exudate.  Neck: Normal range of motion. Neck supple. No thyromegaly present.  Cardiovascular: Normal rate.  Pulmonary/Chest: Effort normal.  Lymphadenopathy:    She has no cervical adenopathy.     Assessment & Plan:  1. Neck pain Referred patient back to PCP for further evaluation. Problem is actually with pain around the region of her thyroid and due to limited scope of clinic, patient requires further work-up and evaluation by her PCP.  Patient verbalized understanding.  If symptoms worsen or do not  improve, return for follow-up, follow-up with PCP, or at the emergency department if severity of symptoms warrant a higher level of care.    Godfrey PickKimberly S. Tiburcio PeaHarris, MSN, FNP-C [facility]  [address]  [place], [place] [address] [phone]

## 2018-06-26 DIAGNOSIS — H938X3 Other specified disorders of ear, bilateral: Secondary | ICD-10-CM | POA: Diagnosis not present

## 2018-07-27 DIAGNOSIS — H6123 Impacted cerumen, bilateral: Secondary | ICD-10-CM | POA: Diagnosis not present

## 2018-07-27 DIAGNOSIS — J301 Allergic rhinitis due to pollen: Secondary | ICD-10-CM | POA: Diagnosis not present

## 2018-07-27 DIAGNOSIS — K140 Glossitis: Secondary | ICD-10-CM | POA: Diagnosis not present

## 2018-07-27 DIAGNOSIS — H9319 Tinnitus, unspecified ear: Secondary | ICD-10-CM | POA: Diagnosis not present

## 2018-09-25 DIAGNOSIS — M25562 Pain in left knee: Secondary | ICD-10-CM | POA: Diagnosis not present

## 2018-09-25 DIAGNOSIS — M25462 Effusion, left knee: Secondary | ICD-10-CM | POA: Diagnosis not present

## 2018-10-27 ENCOUNTER — Ambulatory Visit (INDEPENDENT_AMBULATORY_CARE_PROVIDER_SITE_OTHER): Payer: No Typology Code available for payment source | Admitting: Family Medicine

## 2018-10-27 ENCOUNTER — Encounter: Payer: Self-pay | Admitting: Family Medicine

## 2018-10-27 VITALS — BP 116/78 | HR 64 | Temp 97.6°F | Wt 152.0 lb

## 2018-10-27 DIAGNOSIS — M25562 Pain in left knee: Secondary | ICD-10-CM | POA: Diagnosis not present

## 2018-10-27 MED ORDER — METHYLPREDNISOLONE 4 MG PO TBPK: ORAL_TABLET | ORAL | 0 refills | Status: DC

## 2018-10-27 NOTE — Progress Notes (Signed)
Subjective:    Patient ID: Ashley Goodman, female    DOB: 1955/11/01, 63 y.o.   MRN: [id number]  HPI   Patient presents to clinic to establish with primary care.  She was previously seeing providers at [facility] clinic, but had to change providers due to health insurance change.  Patient's main concern is her left knee pain.  She has been dealing with this pain off and on for a month.  Patient did see orthopedics via congenital clinic, was advised that she most likely has a torn meniscus and was told to do physical therapy.  According to patient, she has not started doing any physical therapy and she continues to have pain in left knee.  Patient did not have MRI of left knee performed at [facility] clinic.  I was able to see x-ray of left knee report via care everywhere.  I was also able to see orthopedic note from [facility] clinic, was advised to do knee exercises and treat pain with anti-inflammatories as needed.  Patient's past medical history, surgical history, social history and family history reviewed and updated accordingly in chart.  Patient Active Problem List   Diagnosis Date Noted  . Borderline diabetes mellitus 04/17/2015  . 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 History of biliary T-tube placement 09/09/2012  . Can't get food down 07/24/2012  . Breast cyst 12/07/2011  . Acquired hypothyroidism 12/06/2011  . Combined fat and carbohydrate induced hyperlipemia 12/06/2011   Social History   Tobacco Use  . Smoking status: Never Smoker  . Smokeless tobacco: Never Used  Substance Use Topics  . Alcohol use: Not on file   Family History  Problem Relation Age of Onset  . Breast cancer Neg Hx    Past Surgical History:  Procedure Laterality Date  . BREAST BIOPSY Left 2004   neg  . BREAST EXCISIONAL BIOPSY Right 06/22/2012   neg/re-excision  . BREAST EXCISIONAL BIOPSY Right 01/03/2012   neg phyllodes tumor w/ margin involvment    Review of  Systems  Constitutional: Negative for chills, fatigue and fever.  HENT: Negative for congestion, ear pain, sinus pain and sore throat.   Eyes: Negative.   Respiratory: Negative for cough, shortness of breath and wheezing.   Cardiovascular: Negative for chest pain, palpitations and leg swelling.  Gastrointestinal: Negative for abdominal pain, diarrhea, nausea and vomiting.  Genitourinary: Negative for dysuria, frequency and urgency.  Musculoskeletal: +left knee pain Skin: Negative for color change, pallor and rash.  Neurological: Negative for syncope, light-headedness and headaches.  Psychiatric/Behavioral: The patient is not nervous/anxious.       Objective:   Physical Exam  Constitutional: She appears well-developed and well-nourished. No distress.  HENT:  Head: Normocephalic and atraumatic.  Eyes: Pupils are equal, round, and reactive to light. EOM are normal. No scleral icterus.  Neck: Normal range of motion. Neck supple. No tracheal deviation present.  Cardiovascular: Normal rate, regular rhythm and normal heart sounds.  Pulmonary/Chest: Effort normal and breath sounds normal. No respiratory distress. She has no wheezes. She has no rales.  Neurological: She is alert and oriented to person, place, and time.  Musculoskeletal: Pain at full extension and full flexion of left knee.  Negative anterior and posterior drawer test.  Unable to produce pain with McMurray's test, but meniscus tear is suspected as per orthopedic note.  No antalgic gait.  Skin: Skin is warm and dry. No pallor.  Psychiatric: She has a normal mood and affect. Her behavior is normal. Thought  content normal.  Nursing note and vitals reviewed.   Vitals:   10/27/18 1603  BP: 116/78  Pulse: 64  Temp: 97.6 F (36.4 C)  SpO2: 94%      Assessment & Plan:    Left knee pain-we will place physical therapy referral for patient so she can get started with physical therapy treatments and exercises.  She will take  steroid taper to help reduce inflammation and pain.  After steroid taper completed advised she can continue to use the meloxicam that was prescribed to her via orthopedics.  If pain continues after 4-week physical therapy sessions, we will plan to move forward with orthopedic referral and MRI of left knee.  Patient will follow-up here in approximately 4 weeks for recheck on left knee as well as complete physical exam.

## 2018-10-27 NOTE — Patient Instructions (Addendum)
Puse en terapia fsica referencia. Alguien le llamar con informacin de cita. Usted har un seguimiento aqu en 4 semanas para ver si el dolor ha mejorado, no entonces nos harn la RMN.  Envi en cinta de esteroides para tomar ms de los prximos 6 das para ayudar a Tourist information centre manager rodilla.    Dolor agudo de [employer] adultos Acute Knee Pain, Adult El dolor agudo de rodilla es repentino y puede deberse a dao, hinchazn o irritacin de los msculos y tejidos que sostienen la rodilla. La lesin puede ser el resultado de:  Una cada.  Una lesin en la rodilla debido a movimientos de rotacin.  Un golpe en la rodilla.  Una infeccin. El dolor agudo de Engineer, manufacturing systems solo con el tiempo y el descanso. De no ser as, su mdico podra indicarle que se haga estudios para hallar la causa del dolor. Estos medicamentos pueden incluir:  Estudios de diagnstico por imgenes, como una radiografa, una resonancia magntica (RM) o una ecografa.  Aspiracin de una articulacin. En [employer], se extrae lquido de la rodilla.  Artroscopia. En [employer], se introduce un tubo iluminado en la rodilla y se proyecta una imagen en una pantalla de televisin.  Biopsia. En [employer], se toma [place] de tejido del organismo y se la estudia con un microscopio. Siga estas indicaciones en su casa: Est atento a cualquier cambio en los sntomas. Tome estas medidas para Engineer, materials. Si tiene una rodillera o un dispositivo ortopdico:   Use la rodillera o el dispositivo ortopdico como se lo haya indicado el mdico. Conservation officer, historic buildings solamente como se lo haya indicado el mdico.  Afloje la rodillera o el dispositivo ortopdico si los dedos de los pies se le entumecen, siente hormigueos o se le enfran y se tornan de Research officer, trade union.  Mantenga la rodillera o el dispositivo ortopdico limpio.  Si la rodillera o el dispositivo ortopdico no es impermeable: ? No deje que se  mojen. ? Cbralo con un envoltorio hermtico cuando tome un bao de inmersin o una ducha. Actividad  Descanse la rodilla.  No haga cosas que le causen dolor o que lo intensifiquen.  Evite las actividades o los ejercicios de alto impacto, como correr, Public relations account executive soga o hacer saltos de tijera.  Trabaje con un fisioterapeuta para crear un programa de ejercicios seguros, segn lo recomiende el mdico. Haga ejercicios como se lo haya indicado el fisioterapeuta. Control del dolor, el entumecimiento y la hinchazn   Si se lo indican, aplique hielo sobre la rodilla: ? Ponga el hielo en una bolsa plstica. ? Coloque una [employer] piel y Copy. ? Coloque el hielo durante , 2 a 3veces por da.  Si se lo indican, use una venda elstica para ejercer presin (compresin) en la rodilla lesionada. Esto puede controlar la hinchazn, darle sostn y ayudar a Paramedic las [place]. Indicaciones generales  [employer] de venta libre y los recetados solamente como se lo haya indicado el mdico.  Cuando est sentado o acostado, levante (eleve) la rodilla por encima del nivel del corazn.  Duerma con una almohada debajo de la rodilla.  No consuma ningn producto que contenga nicotina o tabaco, como cigarrillos, cigarrillos electrnicos y tabaco de Theatre manager. Estos pueden retrasar la recuperacin. Si necesita ayuda para dejar de consumir, consulte al [employer].  Si tiene sobrepeso, trabaje con el mdico y un nutricionista para establecer una meta de prdida de peso que sea saludable y  razonable para usted. El exceso de peso puede generar presin en la rodilla.  Concurra a todas las visitas de control como se lo haya indicado el mdico. Esto es importante. Comunquese con un mdico si:  El dolor de [place], [place].  Tiene fiebre junto con dolor de rodilla.  La rodilla est caliente al tacto.  La rodilla se le tuerce o se le traba. Solicite ayuda  inmediatamente si:  La rodilla se hincha, y la hinchazn empeora.  No puede mover la rodilla.  Siente un dolor intenso en la rodilla. Resumen  El dolor agudo de rodilla puede deberse a una cada, una lesin, una infeccin o a dao, hinchazn o irritacin de los tejidos que sostienen la rodilla.  El mdico podra hacerle estudios para hallar la causa del dolor.  Est atento a cualquier cambio en los sntomas. [employer] dolor con descanso, medicamentos, actividad de poca intensidad y [place] de hielo.  Pida ayuda si el dolor contina o empeora, la rodilla se le hincha, o no puede mover la rodilla. Esta informacin no tiene Theme park manager el consejo del mdico. Asegrese de hacerle al mdico cualquier pregunta que tenga. Document Released: 03/01/2008 Document Revised: 04/24/2018 Document Reviewed: 04/24/2018 Elsevier Interactive Patient Education  2019 [employer].

## 2018-11-28 ENCOUNTER — Encounter: Payer: Self-pay | Admitting: Family Medicine

## 2018-11-28 ENCOUNTER — Ambulatory Visit (INDEPENDENT_AMBULATORY_CARE_PROVIDER_SITE_OTHER): Payer: No Typology Code available for payment source | Admitting: Family Medicine

## 2018-11-28 ENCOUNTER — Other Ambulatory Visit (HOSPITAL_COMMUNITY)
Admission: RE | Admit: 2018-11-28 | Discharge: 2018-11-28 | Disposition: A | Discharge status: Home / Self Care | Payer: No Typology Code available for payment source | Source: Ambulatory Visit | Attending: Family Medicine | Admitting: Family Medicine

## 2018-11-28 VITALS — BP 112/70 | HR 65 | Temp 98.0°F | Resp 18 | Ht 60.0 in | Wt 156.0 lb

## 2018-11-28 DIAGNOSIS — Z1231 Encounter for screening mammogram for malignant neoplasm of breast: Secondary | ICD-10-CM | POA: Diagnosis not present

## 2018-11-28 DIAGNOSIS — Z0000 Encounter for general adult medical examination without abnormal findings: Secondary | ICD-10-CM

## 2018-11-28 DIAGNOSIS — E559 Vitamin D deficiency, unspecified: Secondary | ICD-10-CM

## 2018-11-28 DIAGNOSIS — E039 Hypothyroidism, unspecified: Secondary | ICD-10-CM

## 2018-11-28 DIAGNOSIS — Z124 Encounter for screening for malignant neoplasm of cervix: Secondary | ICD-10-CM

## 2018-11-28 DIAGNOSIS — Z1159 Encounter for screening for other viral diseases: Secondary | ICD-10-CM | POA: Diagnosis not present

## 2018-11-28 DIAGNOSIS — Z114 Encounter for screening for human immunodeficiency virus [HIV]: Secondary | ICD-10-CM

## 2018-11-28 DIAGNOSIS — M25562 Pain in left knee: Secondary | ICD-10-CM | POA: Diagnosis not present

## 2018-11-28 NOTE — Progress Notes (Signed)
Subjective:    Patient ID: Ashley Goodman, female    DOB: [birth year], 63 y.o.   MRN: [id number]  HPI   Patient presents to clinic complete physical exam and also for lab work regarding her hypothyroidism.  Overall she reports she is feeling well.  She is happy she is going to be starting physical therapy in regards to her left knee pain.  Today knee is not in pain.  We will get mammogram ordered today.  Patient believes she had colonoscopy 8 years ago at [facility] hospital, we will confirm documentation of this, if colonoscopy is due we will order.  We will do Pap smear today.  Past medical, social, surgery, family history updated accordingly in chart  Patient Active Problem List   Diagnosis Date Noted  . Borderline diabetes mellitus 04/17/2015  . 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 History of biliary T-tube placement 09/09/2012  . Can't get food down 07/24/2012  . Breast cyst 12/07/2011  . Acquired hypothyroidism 12/06/2011  . Combined fat and carbohydrate induced hyperlipemia 12/06/2011   Social History   Tobacco Use  . Smoking status: Never Smoker  . Smokeless tobacco: Never Used  Substance Use Topics  . Alcohol use: Not on file   Review of Systems  Constitutional: Negative for chills, fatigue and fever.  HENT: Negative for congestion, ear pain, sinus pain and sore throat.   Eyes: Negative.   Respiratory: Negative for cough, shortness of breath and wheezing.   Cardiovascular: Negative for chest pain, palpitations and leg swelling.  Gastrointestinal: Negative for abdominal pain, diarrhea, nausea and vomiting.  Genitourinary: Negative for dysuria, frequency and urgency.  Musculoskeletal: Negative for arthralgias and myalgias.  Skin: Negative for color change, pallor and rash.  Neurological: Negative for syncope, light-headedness and headaches.  Psychiatric/Behavioral: The patient is not nervous/anxious.       Objective:   Physical  Exam Vitals signs and nursing note reviewed.  Constitutional:      Appearance: She is well-developed.  HENT:     Head: Normocephalic and atraumatic.     Right Ear: Tympanic membrane, ear canal and external ear normal.     Left Ear: Tympanic membrane, ear canal and external ear normal.     Nose: Nose normal.     Mouth/Throat:     Mouth: Mucous membranes are moist.     Pharynx: Oropharynx is clear.  Eyes:     General: No scleral icterus.       Right eye: No discharge.        Left eye: No discharge.     Extraocular Movements: Extraocular movements intact.     Conjunctiva/sclera: Conjunctivae normal.     Pupils: Pupils are equal, round, and reactive to light.  Neck:     Musculoskeletal: Normal range of motion and neck supple.     Trachea: No tracheal deviation.  Cardiovascular:     Rate and Rhythm: Normal rate and regular rhythm.     Heart sounds: Normal heart sounds. No murmur. No friction rub. No gallop.   Pulmonary:     Effort: Pulmonary effort is normal. No respiratory distress.     Breath sounds: Normal breath sounds. No wheezing, rhonchi or rales.  Chest:     Breasts:        Right: Normal. No swelling, bleeding, inverted nipple, mass, nipple discharge, skin change or tenderness.        Left: Normal. No swelling, bleeding, inverted nipple, mass, nipple discharge, skin change or tenderness.  Abdominal:     General: Bowel sounds are normal. There is no distension.     Palpations: Abdomen is soft. There is no mass.     Tenderness: There is no abdominal tenderness. There is no right CVA tenderness, left CVA tenderness, guarding or rebound.     Hernia: No hernia is present. There is no hernia in the right inguinal area or left inguinal area.  Genitourinary:    Exam position: Supine.     Pubic Area: No rash or pubic lice.      Labia:        Right: No rash, tenderness, lesion or injury.        Left: No rash, tenderness, lesion or injury.      Vagina: Normal.     Cervix: Normal.      Uterus: Not tender.      Adnexa:        Right: No tenderness.         Left: No tenderness.       Comments: Pap smear collected Musculoskeletal: Normal range of motion.        General: No deformity.  Lymphadenopathy:     Cervical: No cervical adenopathy.     Upper Body:     Right upper body: No supraclavicular, axillary or pectoral adenopathy.     Left upper body: No supraclavicular, axillary or pectoral adenopathy.     Lower Body: No right inguinal adenopathy. No left inguinal adenopathy.  Skin:    General: Skin is warm and dry.     Capillary Refill: Capillary refill takes less than 2 seconds.     Coloration: Skin is not pale.     Findings: No erythema.  Neurological:     Mental Status: She is alert and oriented to person, place, and time.     Cranial Nerves: No cranial nerve deficit.  Psychiatric:        Behavior: Behavior normal.        Thought Content: Thought content normal.    Vitals:   11/28/18 1612  BP: 112/70  Pulse: 65  Resp: 18  Temp: 98 F (36.7 C)  SpO2: 93%      Assessment & Plan:   Well adult exam-mammogram ordered.  Pap smear performed in clinic.  Hep C screening and HIV screening including an annual lab work.  Recommended healthy diet and regular exercise, discussed diet full of lean proteins lots of vegetables, lower carbs/sugars and lots of water.  Recommended physical activity at least 3 to 5 days a week including walking and light weight training.  Discussed wearing seatbelt in vehicle, patient states always wears seatbelt in vehicle.  Discussed safe sun practices including wearing SPF 30 at least, wearing wide brim hat, wearing long sleeves when outdoors for extended period of time.  Hypothyroidism-thyroid panel including blood work.  Left knee pain -- No pain today. Starting PT tomorrow.   We will have patient follow-up in 3 months for recheck on chronic medical conditions.  She is aware she will be contacted in regards to her blood work results.   She is aware she can also return to clinic sooner if any issues arise.

## 2018-11-29 ENCOUNTER — Telehealth: Payer: Self-pay | Admitting: Family Medicine

## 2018-11-29 ENCOUNTER — Ambulatory Visit: Payer: No Typology Code available for payment source | Attending: Family Medicine

## 2018-11-29 DIAGNOSIS — M25562 Pain in left knee: Secondary | ICD-10-CM | POA: Insufficient documentation

## 2018-11-29 DIAGNOSIS — M62838 Other muscle spasm: Secondary | ICD-10-CM | POA: Insufficient documentation

## 2018-11-29 LAB — LIPID PANEL
Cholesterol: 171 mg/dL (ref 0–200)
HDL: 32 mg/dL — ABNORMAL LOW (ref >39.00)
NonHDL: 139.24
Total CHOL/HDL Ratio: 5
Triglycerides: 364 mg/dL — ABNORMAL HIGH (ref 0.0–149.0)
VLDL: 72.8 mg/dL — ABNORMAL HIGH (ref 0.0–40.0)

## 2018-11-29 LAB — COMPREHENSIVE METABOLIC PANEL
ALT: 22 U/L (ref 0–35)
AST: 24 U/L (ref 0–37)
Albumin: 4.1 g/dL (ref 3.5–5.2)
Alkaline Phosphatase: 111 U/L (ref 39–117)
BUN: 14 mg/dL (ref 6–23)
CO2: 27 mEq/L (ref 19–32)
Calcium: 10.3 mg/dL (ref 8.4–10.5)
Chloride: 104 mEq/L (ref 96–112)
Creatinine, Ser: 0.8 mg/dL (ref 0.40–1.20)
GFR: 72.09 mL/min (ref >60.00)
Glucose, Bld: 88 mg/dL (ref 70–99)
Potassium: 4.2 mEq/L (ref 3.5–5.1)
Sodium: 137 mEq/L (ref 135–145)
Total Bilirubin: 0.3 mg/dL (ref 0.2–1.2)
Total Protein: 7.1 g/dL (ref 6.0–8.3)

## 2018-11-29 LAB — HIV ANTIBODY (ROUTINE TESTING W REFLEX): HIV 1&2 Ab, 4th Generation: NONREACTIVE

## 2018-11-29 LAB — LDL CHOLESTEROL, DIRECT: Direct LDL: 107 mg/dL

## 2018-11-29 LAB — CBC
HCT: 39.1 % (ref 36.0–46.0)
Hemoglobin: 13.1 g/dL (ref 12.0–15.0)
MCHC: 33.5 g/dL (ref 30.0–36.0)
MCV: 90.9 fl (ref 78.0–100.0)
Platelets: 306 10*3/uL (ref 150.0–400.0)
RBC: 4.31 Mil/uL (ref 3.87–5.11)
RDW: 13.1 % (ref 11.5–15.5)
WBC: 7.2 10*3/uL (ref 4.0–10.5)

## 2018-11-29 LAB — THYROID PANEL WITH TSH
Free Thyroxine Index: 2.1 (ref 1.4–3.8)
T3 Uptake: 31 % (ref 22–35)
T4, Total: 6.9 ug/dL (ref 5.1–11.9)
TSH: 3.49 mIU/L (ref 0.40–4.50)

## 2018-11-29 LAB — HEPATITIS C ANTIBODY
Hepatitis C Ab: NONREACTIVE
SIGNAL TO CUT-OFF: 0.02 (ref <1.00)

## 2018-11-29 LAB — B12 AND FOLATE PANEL
Folate: >24 ng/mL (ref >5.9)
Vitamin B-12: 640 pg/mL (ref 211–911)

## 2018-11-29 LAB — VITAMIN D 25 HYDROXY (VIT D DEFICIENCY, FRACTURES): VITD: 22.43 ng/mL — ABNORMAL LOW (ref 30.00–100.00)

## 2018-11-29 LAB — HEMOGLOBIN A1C: Hgb A1c MFr Bld: 5.5 % (ref 4.6–6.5)

## 2018-11-29 NOTE — Telephone Encounter (Signed)
Please track down documentation of colonoscopy. She states she had at [facility] about 8 years ago.  We need documentation for the chart

## 2018-11-30 ENCOUNTER — Encounter: Payer: Self-pay | Admitting: Lab

## 2018-11-30 LAB — CYTOLOGY - PAP: Diagnosis: NEGATIVE

## 2018-11-30 MED ORDER — CALCIUM-VITAMIN D3 600-400 MG-UNIT PO CAPS: 2.0000 | ORAL_CAPSULE | Freq: Two times a day (BID) | ORAL | 2 refills | Status: DC

## 2018-11-30 MED ORDER — LEVOTHYROXINE SODIUM 112 MCG PO TABS: 112.0000 ug | ORAL_TABLET | Freq: Every day | ORAL | 1 refills | Status: DC

## 2018-11-30 NOTE — Addendum Note (Signed)
Addended by: Leanora Cover on: 11/30/2018 12:57 PM   Modules accepted: Orders

## 2018-11-30 NOTE — Therapy (Signed)
[place] [facility] [address], [place], [address] Phone: [phone]   Fax:  [phone]  Physical Therapy Evaluation  Patient Details  Name: Ashley Goodman MRN: [id number] Date of Birth: 02-15-1956 Referring Provider (PT): Guse FNP   Encounter Date: 11/29/2018  PT End of Session - 11/30/18 0849    Visit Number  1    Number of Visits  13    Date for PT Re-Evaluation  01/10/19    PT Start Time  1545    PT Stop Time  1645    PT Time Calculation (min)  60 min    Activity Tolerance  Patient tolerated treatment well    Behavior During Therapy  [facility] for tasks assessed/performed       History reviewed. No pertinent past medical history.  Past Surgical History:  Procedure Laterality Date  . BREAST BIOPSY Left 2004   neg  . BREAST EXCISIONAL BIOPSY Right 06/22/2012   neg/re-excision  . BREAST EXCISIONAL BIOPSY Right 01/03/2012   neg phyllodes tumor w/ margin involvment    There were no vitals filed for this visit.   Subjective Assessment - 11/30/18 0841    Subjective  Patient reports increased L knee pain along the medial aspect. Patient reports increase in pain most notably with bending her knee and perform hip ER in sitting. Patient also reports increase in pain with twisting at her job with moving items from one platform to the next, and performing prolonged standing/walking. Patient states when she sits and rests the pain subsquently decreases and reports the pain is most with movement. Patient states she exercises at the [facility] and enjoys the exercises she is doing but is also worries about 'harming' the knee. Patient states she would like to receive some exercises to improve her exercise limitations. Patient reports the onset of pain happened around 2 months prior. Patient states she has been using a brace and that has been helping with her pain.    Pertinent History  hypothyroidism, OA     Limitations  Standing;Walking    How long can you walk comfortably?     Patient Stated Goals  Receive exercises to improve knee pain; decrease overall knee pain    Currently in Pain?  No/denies    Pain Score  0-No pain   Worst pain: 4/10 in past week   Pain Location  Knee    Pain Orientation  Left    Pain Descriptors / Indicators  Aching    Pain Type  Chronic pain    Pain Onset  More than a month ago         [facility] PT Assessment - 11/30/18 0939      Assessment   Medical Diagnosis  L knee pain    Referring Provider (PT)  Guse FNP    Onset Date/Surgical Date  07/29/19    Hand Dominance  Right    Next MD Visit  unknown    Prior Therapy  none      Balance Screen   Has the patient fallen in the past 6 months  No    Has the patient had a decrease in activity level because of a fear of falling?   No    Is the patient reluctant to leave their home because of a fear of falling?   No      Home Public house manager residence    Living Arrangements  Non-relatives/Friends      Prior Function   Level of Independence  Independent    Vocation  Full time employment    Programme researcher, broadcasting/film/video, standing    Leisure  Go to church, exercise      Cognition   Overall Cognitive Status  Within Functional Limits for tasks assessed      Observation/Other Assessments   Observations  Increased swelling along L knee      Sensation   Light Touch  Appears Intact      Functional Tests   Functional tests  Squat;Lunges      Squat   Comments  Increased forward knee translation with motion      Lunges   Comments  Had to perform throughout less motion to decrease pain      ROM / Strength   AROM / PROM / Strength  AROM;Strength      AROM   AROM Assessment Site  Hip;Knee    Right/Left Hip  Left;Right    Right Hip Extension  10    Right Hip Flexion  120    Right Hip External Rotation   45    Right Hip Internal Rotation   40    Right Hip ABduction  30     Right Hip ADduction  20    Left Hip Extension  10    Left Hip Flexion  120    Left Hip External Rotation   45   Increased pain at end range   Left Hip Internal Rotation   40    Left Hip ABduction  40    Left Hip ADduction  20    Right/Left Knee  Right;Left    Right Knee Extension  0    Right Knee Flexion  130    Left Knee Extension  0   Increased pain with performance   Left Knee Flexion  130      Strength   Strength Assessment Site  Hip;Knee    Right/Left Hip  Right;Left    Right Hip Flexion  4+/5    Right Hip Extension  4/5    Right Hip External Rotation   4/5    Right Hip Internal Rotation  4+/5    Right Hip ABduction  4/5    Right Hip ADduction  4/5    Left Hip Flexion  4/5    Left Hip Extension  4+/5    Left Hip External Rotation  4/5   pain   Left Hip Internal Rotation  4/5    Left Hip ABduction  4/5    Left Hip ADduction  4/5    Right/Left Knee  Left;Right    Right Knee Flexion  4+/5    Right Knee Extension  5/5    Left Knee Flexion  4+/5    Left Knee Extension  5/5   Pain at end range     Palpation   Patella mobility  Good    Palpation comment  TTP along vastus medialis distal aspect      Special Tests    Special Tests  Knee Special Tests;Laxity/Instability Tests    Knee Special tests   other;other2;Step-up/Step Down Test      Step-up/Step Down    Findings  Positive    Side   Left    Comments  Poor knee control      other    Findings  Negative    Comments  Valgus, varus stress test B  other   findings  Negative    Comments  Lachmanns B       Objective measurements completed on examination: See above findings.   TREATMENT Therapeutic Exercise  Hip abduction in standing with YTB -- x 10 B Lunges in standing with UE support -- 2 x 10  Knee extension machine -- x 10 35#  Patient demonstrates no increase in pain at the end of the session. Exercises performed to help address hip and quadriceps weakness on the affected side.    PT  Education - 11/30/18 0849    Education Details  POC; HEP: Knee extension machine, hip abduction with YTB, lunges    Person(s) Educated  Patient    Methods  Explanation;Demonstration;Handout;Verbal cues    Comprehension  Verbalized understanding;Returned demonstration          PT Long Term Goals - 11/30/18 0856      PT LONG TERM GOAL #1   Title  Patient will be independent with HEP to continue benefits of therapy until after discharge.    Baseline  Dependent with exercise form/technique     Time  6    Period  Weeks    Status  New    Target Date  01/10/19      PT LONG TERM GOAL #2   Title  Patient will have a worst pain of 0 /10 along the affected knee to allow for performance of greater work related activities    Baseline  3/10 Worst pain    Time  6    Period  Weeks    Status  New    Target Date  01/10/19      PT LONG TERM GOAL #3   Title  Patient will be able to walk for over 2 hours and perform recreational exercise without increase in pain on the R side    Baseline  increased pain with performance    Time  6    Period  Weeks    Status  New    Target Date  01/10/19             Plan - 11/30/18 0850    Clinical Impression Statement  Patient is a 63 yo right hand dominant female presenting with increased L medial knee pain from insidious 2 months prior. Patient demonstrates increased knee dysfunction with signs and symptoms in congruent with a vastus medialis strain. Patient demonstrates decrease in hip strength and poor coordination of forward knee translation which may be further contributing to dysfunction. Patient will benefit from further skilled therapy focused on improving limitations to return to prior level of function.     Personal Factors and Comorbidities  Age;Education;Comorbidity 2    Comorbidities  OA, hypothyroidism    Examination-Activity Limitations  Squat;Stand;Stairs    Examination-Participation Restrictions  Cleaning;Other   Work-related activity    Stability/Clinical Decision Making  Stable/Uncomplicated    Clinical Decision Making  Moderate    Rehab Potential  Good    PT Frequency  2x / week    PT Duration  6 weeks    PT Treatment/Interventions  Therapeutic activities;Therapeutic exercise;Aquatic Therapy;Cryotherapy;Ultrasound;Moist Heat;Electrical Stimulation;Iontophoresis 4mg /ml Dexamethasone;Stair training;Gait training;Functional mobility training;Neuromuscular re-education;Manual techniques;Patient/family education;Passive range of motion;Dry needling;Joint Manipulations;Spinal Manipulations    PT Next Visit Plan  progress strengthening and mobility based exercises    PT Home Exercise Plan  See education section    Consulted and Agree with Plan of Care  Patient       Patient will benefit from  skilled therapeutic intervention in order to improve the following deficits and impairments:  Difficulty walking, Decreased endurance, Decreased activity tolerance, Decreased range of motion, Decreased strength, Hypomobility, Decreased mobility, Decreased coordination, Increased muscle spasms, Pain, Abnormal gait  Visit Diagnosis: Acute pain of left knee  Other muscle spasm     Problem List Patient Active Problem List   Diagnosis Date Noted  . Borderline diabetes mellitus 04/17/2015  . 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 History of biliary T-tube placement 09/09/2012  . Can't get food down 07/24/2012  . Breast cyst 12/07/2011  . Acquired hypothyroidism 12/06/2011  . Combined fat and carbohydrate induced hyperlipemia 12/06/2011    Myrene Galas, PT DPT 11/30/2018, 9:55 AM  [place] [facility] PHYSICAL AND SPORTS MEDICINE [address] [place], [address] Phone: [phone]   Fax:  [phone]  Name: Ashley Goodman MRN: [id number] Date of Birth: [birth year]

## 2018-12-04 ENCOUNTER — Ambulatory Visit: Payer: Self-pay | Admitting: *Deleted

## 2018-12-04 ENCOUNTER — Ambulatory Visit: Payer: No Typology Code available for payment source

## 2018-12-04 DIAGNOSIS — M25562 Pain in left knee: Secondary | ICD-10-CM | POA: Diagnosis not present

## 2018-12-04 DIAGNOSIS — M62838 Other muscle spasm: Secondary | ICD-10-CM

## 2018-12-04 DIAGNOSIS — E039 Hypothyroidism, unspecified: Secondary | ICD-10-CM

## 2018-12-04 NOTE — Telephone Encounter (Signed)
Message from [place] sent at 12/04/2018 3:57 PM EDT   Pt stated that the new dosage of levothyroxine (SYNTHROID, LEVOTHROID) 112 MCG tablet is too much and causing her stomach to hurt. Pt requests call back. Cb# [phone]   Left message for pt to return call to office to discuss symptoms using interpreter, Byrd Hesselbach ID# [phone].

## 2018-12-04 NOTE — Therapy (Signed)
[place] [facility]IC[facility] [address], [place], [address] Phone: [phone]   Fax:  [phone]  Physical Therapy Treatment  Patient Details  Name: Ashley Goodman MRN: [id number] Date of Birth: Sep 22, 1956 Referring Provider (PT): Guse FNP   Encounter Date: 12/04/2018  PT End of Session - 12/04/18 1747    Visit Number  2    Number of Visits  13    Date for PT Re-Evaluation  01/10/19    PT Start Time  1600    PT Stop Time  1645    PT Time Calculation (min)  45 min    Activity Tolerance  Patient tolerated treatment well    Behavior During Therapy  [facility] for tasks assessed/performed       History reviewed. No pertinent past medical history.  Past Surgical History:  Procedure Laterality Date  . BREAST BIOPSY Left 2004   neg  . BREAST EXCISIONAL BIOPSY Right 06/22/2012   neg/re-excision  . BREAST EXCISIONAL BIOPSY Right 01/03/2012   neg phyllodes tumor w/ margin involvment    There were no vitals filed for this visit.  Subjective Assessment - 12/04/18 1627    Subjective  Patient reports increased pain along the L knee that did not seem to help with the exercises performed. Patient reports the pain radaites into the foot when she stands for prolonged periods of time.     Pertinent History  hypothyroidism, OA    Limitations  Standing;Walking    How long can you walk comfortably?     Patient Stated Goals  Receive exercises to improve knee pain; decrease overall knee pain    Currently in Pain?  No/denies    Pain Onset  More than a month ago       TREATMENT Therapeutic Exercise: Standing lumbar/hip extension in standing - 3 x 10  Single leg knee extension in sitting at OMEGA machine - 3 x 5 with 10# Single leg - leg press in sitting at OMEGA machine - 55# 2 x 20 Seated SLUMP neurotension flossing - x 20 Seated SLUMP neurotension glides - x 20  Ambulation with focus on improving speed - 6 x  67ft Hip abduction with YTB in standing - x 20 Hip extension with YTB in standing - x 20 Manual therapy: STM performed to the vastus medialis on the affected side to decrease increased pain and spasms with patient positioned in sitting  Addressed quad limitations with knee extension exercises. And performed SLUMP glides and flossing to address sciatic limitations. Patient requires cuieng on knee positioning throughout the session  PT Education - 12/04/18 1635    Education Details  HEP: straight arm lumbar extension, SLUMP nerve flossing, hip abduction/extension     Person(s) Educated  Patient    Methods  Explanation;Demonstration    Comprehension  Verbalized understanding;Returned demonstration          PT Long Term Goals - 11/30/18 0856      PT LONG TERM GOAL #1   Title  Patient will be independent with HEP to continue benefits of therapy until after discharge.    Baseline  Dependent with exercise form/technique     Time  6    Period  Weeks    Status  New    Target Date  01/10/19      PT LONG TERM GOAL #2   Title  Patient will have a worst pain of 0 /10 along the affected knee to allow  for performance of greater work related activities    Baseline  3/10 Worst pain    Time  6    Period  Weeks    Status  New    Target Date  01/10/19      PT LONG TERM GOAL #3   Title  Patient will be able to walk for over 2 hours and perform recreational exercise without increase in pain on the R side    Baseline  increased pain with performance    Time  6    Period  Weeks    Status  New    Target Date  01/10/19            Plan - 12/04/18 1748    Clinical Impression Statement  Patient demonstrates increased lumbar dysfunction during session with onset of symtpoms with SLUMP positioning and a decrease in symptoms after performing  lumbar extension in standing. Patient continues to have symptoms that along with vastus medialis dysfunction and will continue to address these during the  progression of therapy. Patient continues to have increased pain but is able to walk quickly without pain. Patient will benefit from further skilled therapy to return to prior level of function.     Personal Factors and Comorbidities  Age;Education;Comorbidity 2    Comorbidities  OA, hypothyroidism    Examination-Activity Limitations  Squat;Stand;Stairs    Examination-Participation Restrictions  Cleaning;Other   Work-related activity   Stability/Clinical Decision Making  Stable/Uncomplicated    Rehab Potential  Good    PT Frequency  2x / week    PT Duration  6 weeks    PT Treatment/Interventions  Therapeutic activities;Therapeutic exercise;Aquatic Therapy;Cryotherapy;Ultrasound;Moist Heat;Electrical Stimulation;Iontophoresis 4mg /ml Dexamethasone;Stair training;Gait training;Functional mobility training;Neuromuscular re-education;Manual techniques;Patient/family education;Passive range of motion;Dry needling;Joint Manipulations;Spinal Manipulations    PT Next Visit Plan  progress strengthening and mobility based exercises    PT Home Exercise Plan  See education section    Consulted and Agree with Plan of Care  Patient       Patient will benefit from skilled therapeutic intervention in order to improve the following deficits and impairments:  Difficulty walking, Decreased endurance, Decreased activity tolerance, Decreased range of motion, Decreased strength, Hypomobility, Decreased mobility, Decreased coordination, Increased muscle spasms, Pain, Abnormal gait  Visit Diagnosis: Acute pain of left knee  Other muscle spasm     Problem List Patient Active Problem List   Diagnosis Date Noted  . Borderline diabetes mellitus 04/17/2015  . 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 History of biliary T-tube placement 09/09/2012  . Can't get food down 07/24/2012  . Breast cyst 12/07/2011  . Acquired hypothyroidism 12/06/2011  . Combined fat and carbohydrate induced hyperlipemia 12/06/2011     Myrene Galas, PT DPT 12/04/2018, 5:54 PM  [place] [facility] PHYSICAL AND SPORTS MEDICINE [address], [place], [address] Phone: [phone]   Fax:  [phone]  Name: Ashley Goodman MRN: [id number] Date of Birth: 01-20-1956

## 2018-12-04 NOTE — Telephone Encounter (Signed)
The 112 mcg dose is the dose I had listed in her chart as what she was taking and renewed that dose because her thyroid panel looks good.    What was the dose she had been taking?

## 2018-12-04 NOTE — Telephone Encounter (Signed)
Pt returned call and states that she has been taking 25 mcg but states she does not have her bottle with her at this time to confirm. Pt notified that the last dose noted in her chart is 50 mcg and not 25 mcg. Pt states she has been taking 25 mcg as ordered from her last provider for at least 8 years. Pt states that with taking the Levothyroxine 112 mch tablet she has experienced a burning sensation in her stomach because she takes it on a empty stomach. Pt states that this is not a new symptom and has experienced this sensation since she has been taking Levothyroxine 8 years ago. Pt concerned that she is experiencing the increased burning sensation due to the new dosage of Levothyroxine. Interpreter Moises ID # [id number] used during call.

## 2018-12-05 MED ORDER — LEVOTHYROXINE SODIUM 25 MCG PO TABS: 25.0000 ug | ORAL_TABLET | Freq: Every day | ORAL | 1 refills | Status: DC

## 2018-12-05 NOTE — Telephone Encounter (Signed)
Attempted to contact patient via interpreter [phone]. VM to return call to office.

## 2018-12-05 NOTE — Telephone Encounter (Signed)
Prescription for 25 mcg levothyroxine sent into pharmacy.  Stop the 112 mcg dose.  The abdominal pain that may not be related to levothyroxine, burning in stomach could be from heartburn type sensation.  If pain in abdomen persist after medication adjustment for the thyroid, please advised patient to make appointment for evaluation.

## 2018-12-05 NOTE — Telephone Encounter (Signed)
Called Pt no answer, was calling to tell Pt Prescription for 25 mcg levothyroxine sent into pharmacy.  Stop the 112 mcg dose.  The abdominal pain that may not be related to levothyroxine, burning in stomach could be from heartburn type sensation.  If pain in abdomen persist after medication adjustment for the thyroid, please advised patient to make appointment for evaluation Okay for Pec to speak to Pt.

## 2018-12-06 ENCOUNTER — Telehealth: Payer: Self-pay | Admitting: Family Medicine

## 2018-12-06 ENCOUNTER — Ambulatory Visit: Payer: No Typology Code available for payment source

## 2018-12-06 DIAGNOSIS — E039 Hypothyroidism, unspecified: Secondary | ICD-10-CM

## 2018-12-06 MED ORDER — LEVOTHYROXINE SODIUM 25 MCG PO TABS: 25.0000 ug | ORAL_TABLET | Freq: Every day | ORAL | 1 refills | Status: DC

## 2018-12-06 NOTE — Addendum Note (Signed)
Addended by: Leanora Cover on: 12/06/2018 04:22 PM   Modules accepted: Orders

## 2018-12-06 NOTE — Telephone Encounter (Signed)
Dose: 25 mcg Route: Oral Frequency: Daily before breakfast  Note to Pharmacy:  Cancel Rx for 112 mcg dose      Indications of Use: Hypothyroidism  Dispense Quantity: 90 tablet Refills: 1 Fills remaining: --        Sig: Take 1 tablet (25 mcg total) by mouth daily before breakfast.       Written Date: 12/05/18 Expiration Date: 12/05/19    Start Date: 12/05/18 End Date: --         Ordering Provider: -- DEA #:  -- NPI:  --   Authorizing Provider: Tracey Harries, FNP DEA #:  [id number] NPI:  [id number]   Ordering User:  Tracey Harries, FNP          Diagnosis Association: Acquired hypothyroidism (E03.9)    Pharmacy:  [facility] - [place], [place] - [address] DEA #:  --     I sent it on 12/05/2018  Re-sent.   Noreene Larsson -- if a med is on the persons med list and just needs to be re sent or faxed, you can do this.

## 2018-12-06 NOTE — Telephone Encounter (Signed)
Message from Leanora Cover read to patient via interpreter [facility] # [phone].  Pt verbalized understanding of all instructions. Pt states that her pharmacy has not received the new medication dose from her physician.  I assured patient that I will look into the problem and have the pharmacy call when her medication is ready. NT spoke to [facility] at [facility].  Telephone order was never received.  Will route to office.

## 2018-12-11 ENCOUNTER — Ambulatory Visit: Payer: No Typology Code available for payment source

## 2018-12-11 NOTE — Telephone Encounter (Signed)
Called Pt and she stated she has a copy of her colonoscopy and will bring in a copy of it to the office.

## 2018-12-13 ENCOUNTER — Ambulatory Visit: Payer: No Typology Code available for payment source

## 2018-12-20 ENCOUNTER — Ambulatory Visit: Payer: No Typology Code available for payment source

## 2018-12-25 ENCOUNTER — Ambulatory Visit: Payer: No Typology Code available for payment source

## 2019-02-26 ENCOUNTER — Telehealth: Payer: Self-pay | Admitting: *Deleted

## 2019-02-26 NOTE — Telephone Encounter (Signed)
Copied from CRM [id number]. Topic: Appointment Scheduling - Scheduling Inquiry for Clinic >> Feb 26, 2019  1:00 PM Mickel Baas B, [place] wrote: Reason for CRM: Patient requesting to cancel her appointment for 03/01/2019. Attempted to reach office to cancel this appointment x2, no answer. Verified that patient wants to cancel, does not wish to reschedule at this time. Please advise.

## 2019-02-27 NOTE — Telephone Encounter (Signed)
appt was cancelled

## 2019-03-01 ENCOUNTER — Ambulatory Visit: Payer: Self-pay | Admitting: Family Medicine

## 2019-04-17 ENCOUNTER — Ambulatory Visit
Admission: RE | Admit: 2019-04-17 | Discharge: 2019-04-17 | Disposition: A | Discharge status: Home / Self Care | Payer: No Typology Code available for payment source | Source: Ambulatory Visit | Attending: Family Medicine | Admitting: Family Medicine

## 2019-04-17 DIAGNOSIS — Z1231 Encounter for screening mammogram for malignant neoplasm of breast: Secondary | ICD-10-CM | POA: Diagnosis not present

## 2019-04-20 ENCOUNTER — Encounter: Payer: Self-pay | Admitting: Lab

## 2019-06-05 ENCOUNTER — Other Ambulatory Visit: Payer: Self-pay

## 2019-06-05 ENCOUNTER — Ambulatory Visit (INDEPENDENT_AMBULATORY_CARE_PROVIDER_SITE_OTHER): Payer: No Typology Code available for payment source | Admitting: Family Medicine

## 2019-06-05 VITALS — BP 118/78 | HR 65 | Temp 96.5°F | Resp 16 | Ht 59.0 in | Wt 150.6 lb

## 2019-06-05 DIAGNOSIS — E039 Hypothyroidism, unspecified: Secondary | ICD-10-CM | POA: Diagnosis not present

## 2019-06-05 DIAGNOSIS — E559 Vitamin D deficiency, unspecified: Secondary | ICD-10-CM

## 2019-06-05 DIAGNOSIS — R5383 Other fatigue: Secondary | ICD-10-CM

## 2019-06-05 DIAGNOSIS — I83813 Varicose veins of bilateral lower extremities with pain: Secondary | ICD-10-CM | POA: Diagnosis not present

## 2019-06-05 NOTE — Progress Notes (Signed)
Subjective:    Patient ID: Ashley Goodman, female    DOB: 1955-11-28, 63 y.o.   MRN: [id number]  HPI   Patient presents to clinic for follow-up on hypothyroidism and also complains of some pain in left lower leg.  Patient's been tolerating her levothyroxine at 25 mcg/day well.  Does report sometimes a fatigue, but not sure if it is related to thyroid or not always getting enough sleep.  Patient states she has a history of varicose veins and did at one point get injections in the veins in her home country before moving to the [place].  She tries to wear tall socks while working, because she is on her feet for many hours throughout the day.   Patient Active Problem List   Diagnosis Date Noted  . Borderline diabetes mellitus 04/17/2015  . 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 History of biliary T-tube placement 09/09/2012  . Can't get food down 07/24/2012  . Breast cyst 12/07/2011  . Acquired hypothyroidism 12/06/2011  . Combined fat and carbohydrate induced hyperlipemia 12/06/2011   Social History   Tobacco Use  . Smoking status: Never Goodman  . Smokeless tobacco: Never Used  Substance Use Topics  . Alcohol use: Not on file    Review of Systems  Constitutional: Negative for chills, and fever. +fatigue at times HENT: Negative for congestion, ear pain, sinus pain and sore throat.   Eyes: Negative.   Respiratory: Negative for cough, shortness of breath and wheezing.   Cardiovascular: Negative for chest pain, palpitations and leg swelling. +pain in left leg from veins Gastrointestinal: Negative for abdominal pain, diarrhea, nausea and vomiting.  Genitourinary: Negative for dysuria, frequency and urgency.  Musculoskeletal: Negative for arthralgias and myalgias.  Skin: Negative for color change, pallor and rash.  Neurological: Negative for syncope, light-headedness and headaches.  Psychiatric/Behavioral: The patient is not nervous/anxious.       Objective:    Physical Exam Vitals signs and nursing note reviewed.  Constitutional:      General: She is not in acute distress.    Appearance: She is not ill-appearing, toxic-appearing or diaphoretic.  HENT:     Head: Normocephalic and atraumatic.  Eyes:     General: No scleral icterus.    Extraocular Movements: Extraocular movements intact.     Conjunctiva/sclera: Conjunctivae normal.     Pupils: Pupils are equal, round, and reactive to light.  Neck:     Musculoskeletal: Neck supple.  Cardiovascular:     Rate and Rhythm: Normal rate and regular rhythm.     Heart sounds: Normal heart sounds. No murmur. No friction rub. No gallop.      Comments: +pedal pulses bilat Varicose veins on LLE, some are tender to touch Pulmonary:     Effort: Pulmonary effort is normal. No respiratory distress.     Breath sounds: Normal breath sounds.  Neurological:     General: No focal deficit present.     Mental Status: She is alert and oriented to person, place, and time.     Gait: Gait normal.  Psychiatric:        Mood and Affect: Mood normal.        Behavior: Behavior normal.        Thought Content: Thought content normal.        Judgment: Judgment normal.     Today's Vitals   06/05/19 1535  BP: 118/78  Pulse: 65  Resp: 16  Temp: (!) 96.5 F (35.8 C)  TempSrc:  Temporal  SpO2: 96%  Weight: 150 lb 9.6 oz (68.3 kg)  Height: 4\' 11"  (1.499 m)   Body mass index is 30.42 kg/m.     Assessment & Plan:    Fatigue, hypothyroidism, vitamin D deficiency-we will follow-up with new blood work to monitor vitamin D levels, thyroid levels and other possible causes of fatigue.  We will see if patient's levothyroxine dose needs to be adjusted.  Discussed eating a healthy diet getting regular exercise and getting proper sleep as different ways that can help improve feelings of fatigue.  Varicose veins-patient does have some tender varicose veins, we will get vascular study to look at blood flow.  We will most  likely end up referring to vascular surgery for evaluation and management of the veins.  Recommended wearing compression stockings especially while working to help improve blood flow.  Patient aware someone will contact her with lab results and vascular study appointment.  Advised she can return to clinic sooner if any issues arise, we will plan for her to follow-up in 6 months for management of chronic conditions.

## 2019-06-06 ENCOUNTER — Encounter: Payer: Self-pay | Admitting: Family Medicine

## 2019-06-06 DIAGNOSIS — E559 Vitamin D deficiency, unspecified: Secondary | ICD-10-CM

## 2019-06-06 LAB — COMPREHENSIVE METABOLIC PANEL
ALT: 17 U/L (ref 0–35)
AST: 20 U/L (ref 0–37)
Albumin: 4.1 g/dL (ref 3.5–5.2)
Alkaline Phosphatase: 113 U/L (ref 39–117)
BUN: 18 mg/dL (ref 6–23)
CO2: 29 mEq/L (ref 19–32)
Calcium: 10.2 mg/dL (ref 8.4–10.5)
Chloride: 105 mEq/L (ref 96–112)
Creatinine, Ser: 0.74 mg/dL (ref 0.40–1.20)
GFR: 78.74 mL/min (ref >60.00)
Glucose, Bld: 66 mg/dL — ABNORMAL LOW (ref 70–99)
Potassium: 4 mEq/L (ref 3.5–5.1)
Sodium: 140 mEq/L (ref 135–145)
Total Bilirubin: 0.3 mg/dL (ref 0.2–1.2)
Total Protein: 6.5 g/dL (ref 6.0–8.3)

## 2019-06-06 LAB — CBC WITH DIFFERENTIAL/PLATELET
Basophils Absolute: 0.1 10*3/uL (ref 0.0–0.1)
Basophils Relative: 1.2 % (ref 0.0–3.0)
Eosinophils Absolute: 0.1 10*3/uL (ref 0.0–0.7)
Eosinophils Relative: 1.8 % (ref 0.0–5.0)
HCT: 37.2 % (ref 36.0–46.0)
Hemoglobin: 12.6 g/dL (ref 12.0–15.0)
Lymphocytes Relative: 39.1 % (ref 12.0–46.0)
Lymphs Abs: 2.3 10*3/uL (ref 0.7–4.0)
MCHC: 33.9 g/dL (ref 30.0–36.0)
MCV: 90.4 fl (ref 78.0–100.0)
Monocytes Absolute: 0.3 10*3/uL (ref 0.1–1.0)
Monocytes Relative: 4.6 % (ref 3.0–12.0)
Neutro Abs: 3.2 10*3/uL (ref 1.4–7.7)
Neutrophils Relative %: 53.3 % (ref 43.0–77.0)
Platelets: 227 10*3/uL (ref 150.0–400.0)
RBC: 4.11 Mil/uL (ref 3.87–5.11)
RDW: 12.9 % (ref 11.5–15.5)
WBC: 5.9 10*3/uL (ref 4.0–10.5)

## 2019-06-06 LAB — VITAMIN D 25 HYDROXY (VIT D DEFICIENCY, FRACTURES): VITD: 16.35 ng/mL — ABNORMAL LOW (ref 30.00–100.00)

## 2019-06-06 LAB — B12 AND FOLATE PANEL
Folate: 17.4 ng/mL (ref >5.9)
Vitamin B-12: 428 pg/mL (ref 211–911)

## 2019-06-06 LAB — HEMOGLOBIN A1C: Hgb A1c MFr Bld: 5.5 % (ref 4.6–6.5)

## 2019-06-06 LAB — TSH: TSH: 3.36 u[IU]/mL (ref 0.35–4.50)

## 2019-06-06 MED ORDER — VITAMIN D (ERGOCALCIFEROL) 1.25 MG (50000 UNIT) PO CAPS: 50000.0000 [IU] | ORAL_CAPSULE | ORAL | 0 refills | Status: DC

## 2019-06-06 NOTE — Progress Notes (Signed)
Please mail results letter to patient & schedule for 12 week lab visit

## 2019-08-01 ENCOUNTER — Encounter (INDEPENDENT_AMBULATORY_CARE_PROVIDER_SITE_OTHER): Payer: No Typology Code available for payment source

## 2019-08-01 ENCOUNTER — Encounter (INDEPENDENT_AMBULATORY_CARE_PROVIDER_SITE_OTHER): Payer: No Typology Code available for payment source | Admitting: Nurse Practitioner

## 2019-08-07 ENCOUNTER — Telehealth: Payer: Self-pay | Admitting: *Deleted

## 2019-08-07 NOTE — Telephone Encounter (Signed)
Copied from [place] [id number]. Topic: Appointment Scheduling - Scheduling Inquiry for Clinic >> Aug 07, 2019  3:59 PM Erick Blinks wrote: Reason for CRM: Pt called requesting to be est with Dr. Derrel Nip, was a pt with her for many years at previous practice. Please advise

## 2019-08-07 NOTE — Telephone Encounter (Signed)
I am not taking any new patients at this time.  

## 2019-08-08 ENCOUNTER — Encounter (INDEPENDENT_AMBULATORY_CARE_PROVIDER_SITE_OTHER): Payer: Self-pay | Admitting: Nurse Practitioner

## 2019-08-08 ENCOUNTER — Other Ambulatory Visit: Payer: Self-pay

## 2019-08-08 ENCOUNTER — Ambulatory Visit (INDEPENDENT_AMBULATORY_CARE_PROVIDER_SITE_OTHER): Payer: No Typology Code available for payment source | Admitting: Nurse Practitioner

## 2019-08-08 ENCOUNTER — Ambulatory Visit (INDEPENDENT_AMBULATORY_CARE_PROVIDER_SITE_OTHER): Payer: No Typology Code available for payment source

## 2019-08-08 VITALS — BP 113/71 | HR 60 | Resp 15 | Wt 149.8 lb

## 2019-08-08 DIAGNOSIS — I83813 Varicose veins of bilateral lower extremities with pain: Secondary | ICD-10-CM | POA: Diagnosis not present

## 2019-08-08 DIAGNOSIS — E7800 Pure hypercholesterolemia, unspecified: Secondary | ICD-10-CM

## 2019-08-12 ENCOUNTER — Encounter (INDEPENDENT_AMBULATORY_CARE_PROVIDER_SITE_OTHER): Payer: Self-pay | Admitting: Nurse Practitioner

## 2019-08-12 NOTE — Progress Notes (Signed)
SUBJECTIVE:  Patient ID: Ashley Goodman, female    DOB: 25-Apr-1956, 63 y.o.   MRN: [id number] Chief Complaint  Patient presents with  . New Patient (Initial Visit)    ref Guse for varicose veins    HPI  Kenslei Ashley Goodman is a 63 y.o. female The patient is seen for evaluation of symptomatic varicose veins of the left lower extremity. The patient relates burning and stinging which worsened steadily throughout the course of the day, particularly with standing. The patient also notes an aching and throbbing pain over the varicosities, particularly with prolonged dependent positions. The symptoms are significantly improved with elevation.  The patient also notes that during hot weather the symptoms are greatly intensified. The patient states the pain from the varicose veins interferes with work, daily exercise, shopping and household maintenance. At this point, the symptoms are persistent and severe enough that they're having a negative impact on lifestyle and are interfering with daily activities.  There is no history of DVT, PE or superficial thrombophlebitis. There is no history of ulceration or hemorrhage. The patient denies a significant family history of varicose veins.   The patient has not worn graduated compression in the past. At the present time the patient has not been using over-the-counter analgesics. There is no history of prior surgical intervention or sclerotherapy.  The patient underwent noninvasive studies today.  No evidence of DVT or superficial venous thrombosis in the left lower extremity.  There is evidence of reflux in the femoral vein.  There is also evidence of reflux from the great saphenous vein at the saphenofemoral junction to the knee.  There is also reflux in the small saphenous vein.  Past Medical History:  Diagnosis Date  . Thyroid disease     Past Surgical History:  Procedure Laterality Date  . BREAST BIOPSY Left 2004   neg  . BREAST EXCISIONAL BIOPSY Right 06/22/2012   neg/re-excision  . BREAST EXCISIONAL BIOPSY Right 01/03/2012   neg phyllodes tumor w/ margin involvment    Social History   Socioeconomic History  . Marital status: Divorced    Spouse name: Not on file  . Number of children: Not on file  . Years of education: Not on file  . Highest education level: Not on file  Occupational History  . Not on file  Social Needs  . Financial resource strain: Not on file  . Food insecurity    Worry: Not on file    Inability: Not on file  . Transportation needs    Medical: Not on file    Non-medical: Not on file  Tobacco Use  . Smoking status: Never Goodman  . Smokeless tobacco: Never Used  Substance and Sexual Activity  . Alcohol use: Not on file  . Drug use: Never  . Sexual activity: Not on file  Lifestyle  . Physical activity    Days per week: Not on file    Minutes per session: Not on file  . Stress: Not on file  Relationships  . Social Herbalist on phone: Not on file    Gets together: Not on file    Attends religious service: Not on file    Active member of club or organization: Not on file    Attends meetings of clubs or organizations: Not on file    Relationship status: Not on file  . Intimate partner violence    Fear of current or ex partner: Not on file  Emotionally abused: Not on file    Physically abused: Not on file    Forced sexual activity: Not on file  Other Topics Concern  . Not on file  Social History Narrative  . Not on file    Family History  Problem Relation Age of Onset  . Breast cancer Neg Hx     No Known Allergies   Review of Systems   Review of Systems: Negative Unless Checked Constitutional: [] Weight loss  [] Fever  [] Chills Cardiac: [] Chest pain   []  Atrial Fibrillation  [] Palpitations   [] Shortness of breath when laying flat   [] Shortness of breath with exertion. [] Shortness of breath at rest Vascular:  [] Pain in legs with walking    [] Pain in legs with standing [] Pain in legs when laying flat   [] Claudication    [] Pain in feet when laying flat    [] History of DVT   [] Phlebitis   [x] Swelling in legs   [x] Varicose veins   [] Non-healing ulcers Pulmonary:   [] Uses home oxygen   [] Productive cough   [] Hemoptysis   [] Wheeze  [] COPD   [] Asthma Neurologic:  [] Dizziness   [] Seizures  [] Blackouts [] History of stroke   [] History of TIA  [] Aphasia   [] Temporary Blindness   [] Weakness or numbness in arm   [] Weakness or numbness in leg Musculoskeletal:   [] Joint swelling   [] Joint pain   [] Low back pain  []  History of Knee Replacement [x] Arthritis [] back Surgeries  []  Spinal Stenosis    Hematologic:  [] Easy bruising  [] Easy bleeding   [] Hypercoagulable state   [] Anemic Gastrointestinal:  [] Diarrhea   [] Vomiting  [] Gastroesophageal reflux/heartburn   [] Difficulty swallowing. [] Abdominal pain Genitourinary:  [] Chronic kidney disease   [] Difficult urination  [] Anuric   [] Blood in urine [] Frequent urination  [] Burning with urination   [] Hematuria Skin:  [] Rashes   [] Ulcers [] Wounds Psychological:  [] History of anxiety   []  History of major depression  []  Memory Difficulties      OBJECTIVE:   Physical Exam  BP 113/71 (BP Location: Right Arm)   Pulse 60   Resp 15   Wt 149 lb 12.8 oz (67.9 kg)   BMI 30.26 kg/m   Gen: WD/WN, NAD Head: [place]/AT, No temporalis wasting.  Ear/Nose/Throat: Hearing grossly intact, nares w/o erythema or drainage Eyes: PER, EOMI, sclera nonicteric.  Neck: Supple, no masses.  No JVD.  Pulmonary:  Good air movement, no use of accessory muscles.  Cardiac: RRR Vascular: scattered varicosities present bilaterally.  Mild venous stasis changes to the legs bilaterally.  2+ soft pitting edema  Vessel Right Left  Radial Palpable Palpable  Dorsalis Pedis Palpable Palpable  Posterior Tibial Palpable Palpable   Gastrointestinal: soft, non-distended. No guarding/no peritoneal signs.  Musculoskeletal: M/S 5/5 throughout.   No deformity or atrophy.  Neurologic: Pain and light touch intact in extremities.  Symmetrical.  Speech is fluent. Motor exam as listed above. Psychiatric: Judgment intact, Mood & affect appropriate for pt's clinical situation. Dermatologic: No Venous rashes. No Ulcers Noted.  No changes consistent with cellulitis. Lymph : No Cervical lymphadenopathy, no lichenification or skin changes of chronic lymphedema.       ASSESSMENT AND PLAN:  1. Varicose veins of both lower extremities with pain  Recommend:  The patient has large symptomatic varicose veins that are painful and associated with swelling.  I have had a long discussion with the patient regarding  varicose veins and why they cause symptoms.  Patient will begin wearing graduated compression stockings class 1 on a  daily basis, beginning first thing in the morning and removing them in the evening. The patient is instructed specifically not to sleep in the stockings.    The patient  will also begin using over-the-counter analgesics such as Motrin 600 mg po TID to help control the symptoms.    In addition, behavioral modification including elevation during the day will be initiated.    Pending the results of these changes the  patient will be reevaluated in three months.     Further plans will be based on the ultrasound results and whether conservative therapies are successful at eliminating the pain and swelling.   2. Pure hypercholesterolemia Continue statin as ordered and reviewed, no changes at this time    Current Outpatient Medications on File Prior to Visit  Medication Sig Dispense Refill  . Cholecalciferol (D-3-5) 125 MCG (5000 UT) capsule Take 5,000 Units by mouth daily.    Marland Kitchen. levothyroxine (SYNTHROID, LEVOTHROID) 25 MCG tablet Take 1 tablet (25 mcg total) by mouth daily before breakfast. 90 tablet 1  . Omega-3 Fatty Acids (OMEGA 3 500 PO) Take by mouth daily.    . Vitamin D, Ergocalciferol, (DRISDOL) 1.25 MG (50000 UT)  CAPS capsule Take 1 capsule (50,000 Units total) by mouth every 7 (seven) days. 12 capsule 0  . Calcium Carb-Cholecalciferol (CALCIUM-VITAMIN D3) 600-400 MG-UNIT CAPS Take 2 tablets by mouth 2 (two) times daily. (Patient not taking: Reported on 08/08/2019) 60 capsule 2  . fexofenadine-pseudoephedrine (ALLEGRA-D) 60-120 MG 12 hr tablet Take 1 tablet 2 (two) times daily by mouth. (Patient not taking: Reported on 08/08/2019) 20 tablet 0  . meloxicam (MOBIC) 15 MG tablet Take 1 tablet (15 mg total) by mouth daily. (Patient not taking: Reported on 08/08/2019) 30 tablet 0   No current facility-administered medications on file prior to visit.     There are no Patient Instructions on file for this visit. No follow-ups on file.   Georgiana SpinnerFallon E Vicki Chaffin, NP  This note was completed with Office managerDragon Dictation.  Any errors are purely unintentional.

## 2019-08-15 NOTE — Telephone Encounter (Signed)
Copied from [place] [id number]. Topic: General - Other >> Aug 15, 2019 12:31 PM Leward Quan A wrote: Reason for CRM: Patient called to say that she had a missed call from the office but there was nothing noted in her chart. Please advise and call patient at Ph# [phone]

## 2019-08-16 NOTE — Telephone Encounter (Signed)
Dr. Derrel Nip is not accepting any new pts at this time. Pt is scheduled with Dr. Aundra Dubin.

## 2019-11-13 ENCOUNTER — Ambulatory Visit (INDEPENDENT_AMBULATORY_CARE_PROVIDER_SITE_OTHER): Payer: No Typology Code available for payment source | Admitting: Vascular Surgery

## 2019-11-13 ENCOUNTER — Other Ambulatory Visit: Payer: Self-pay

## 2019-11-13 ENCOUNTER — Encounter (INDEPENDENT_AMBULATORY_CARE_PROVIDER_SITE_OTHER): Payer: Self-pay | Admitting: Vascular Surgery

## 2019-11-13 VITALS — BP 105/67 | HR 66 | Resp 18 | Ht 60.0 in | Wt 153.0 lb

## 2019-11-13 DIAGNOSIS — I83812 Varicose veins of left lower extremities with pain: Secondary | ICD-10-CM | POA: Diagnosis not present

## 2019-11-13 DIAGNOSIS — R7303 Prediabetes: Secondary | ICD-10-CM | POA: Diagnosis not present

## 2019-11-13 NOTE — Assessment & Plan Note (Signed)
blood glucose control important in reducing the progression of atherosclerotic disease. Also, involved in wound healing.   

## 2019-11-13 NOTE — Progress Notes (Signed)
Patient ID: Ashley Goodman, female   DOB: 02/04/1956, 64 y.o.   MRN: [id number]  Chief Complaint  Patient presents with  . Follow-up    3 month f/u     HPI Ashley Goodman is a 64 y.o. female.  Patient returns in follow up of their venous disease.  They have done their best to comply with the prescribed conservative therapies of compression stockings, leg elevation, exercise, and still requires anti-inflammatories for discomfort and has symptoms that are persistent and bothersome on a daily basis, affecting their activities of daily living and normal activities.  She has been wearing her stockings and doing appropriate conservative measures for over 3 months now.  The venous reflux study demonstrates no DVT or superficial thrombophlebitis, but significant venous reflux was seen in the left great saphenous vein as well as the left small saphenous vein.    Past Medical History:  Diagnosis Date  . Thyroid disease     Past Surgical History:  Procedure Laterality Date  . BREAST BIOPSY Left 2004   neg  . BREAST EXCISIONAL BIOPSY Right 06/22/2012   neg/re-excision  . BREAST EXCISIONAL BIOPSY Right 01/03/2012   neg phyllodes tumor w/ margin involvment    Family History  Problem Relation Age of Onset  . Breast cancer Neg Hx     Social History   Tobacco Use  . Smoking status: Never Goodman  . Smokeless tobacco: Never Used  Substance Use Topics  . Alcohol use: Not on file  . Drug use: Never     No Known Allergies  Current Outpatient Medications  Medication Sig Dispense Refill  . Cholecalciferol (D-3-5) 125 MCG (5000 UT) capsule Take 5,000 Units by mouth daily.    Marland Kitchen levothyroxine (SYNTHROID, LEVOTHROID) 25 MCG tablet Take 1 tablet (25 mcg total) by mouth daily before breakfast. 90 tablet 1  . Omega-3 Fatty Acids (OMEGA 3 500 PO) Take by mouth daily.    . Vitamin D, Ergocalciferol, (DRISDOL) 1.25 MG (50000 UT) CAPS capsule Take 1 capsule  (50,000 Units total) by mouth every 7 (seven) days. 12 capsule 0  . Calcium Carb-Cholecalciferol (CALCIUM-VITAMIN D3) 600-400 MG-UNIT CAPS Take 2 tablets by mouth 2 (two) times daily. (Patient not taking: Reported on 08/08/2019) 60 capsule 2  . fexofenadine-pseudoephedrine (ALLEGRA-D) 60-120 MG 12 hr tablet Take 1 tablet 2 (two) times daily by mouth. (Patient not taking: Reported on 08/08/2019) 20 tablet 0  . meloxicam (MOBIC) 15 MG tablet Take 1 tablet (15 mg total) by mouth daily. (Patient not taking: Reported on 08/08/2019) 30 tablet 0   No current facility-administered medications for this visit.      REVIEW OF SYSTEMS  Review of Systems: Negative Unless Checked Constitutional: [] ?Weight loss[] ?Fever[] ?Chills Cardiac:[] ?Chest pain[] ? Atrial Fibrillation[] ?Palpitations [] ?Shortness of breath when laying flat [] ?Shortness of breath with exertion. [] ?Shortness of breath at rest Vascular: [] ?Pain in legs with walking[] ?Pain in legswith standing[] ?Pain in legs when laying flat [] ?Claudication  [] ?Pain in feet when laying flat [] ?History of DVT [] ?Phlebitis [x] ?Swelling in legs [x] ?Varicose veins [] ?Non-healing ulcers Pulmonary: [] ?Uses home oxygen [] ?Productive cough[] ?Hemoptysis [] ?Wheeze [] ?COPD [] ?Asthma Neurologic: [] ?Dizziness[] ?Seizures [] ?Blackouts[] ?History of stroke [] ?History of TIA[] ?Aphasia [] ?Temporary Blindness[] ?Weaknessor numbness in arm [] ?Weakness or numbnessin leg Musculoskeletal:[] ?Joint swelling [] ?Joint pain [] ?Low back pain  [] ? History of Knee Replacement [x] ?Arthritis [] ?back Surgeries[] ? Spinal Stenosis  Hematologic:[] ?Easy bruising[] ?Easy bleeding [] ?Hypercoagulable state [] ?Anemic Gastrointestinal:[] ?Diarrhea [] ?Vomiting[] ?Gastroesophageal reflux/heartburn[] ?Difficulty swallowing. [] ?Abdominal pain Genitourinary: [] ?Chronic kidney disease [] ?Difficulturination  [] ?Anuric[] ?Blood in urine [] ?Frequenturination [] ?Burning with urination[] ?Hematuria Skin: [] ?Rashes [] ?Ulcers [] ?Wounds Psychological: [] ?History of  anxiety[] ?History of major depression  [] ? Memory Difficulties    Physical Exam BP 105/67 (BP Location: Right Arm)   Pulse 66   Resp 18   Ht 5' (1.524 m)   Wt 153 lb (69.4 kg)   BMI 29.88 kg/m  Gen:  WD/WN, NAD Head: [place]/AT, No temporalis wasting.  Ear/Nose/Throat: Hearing grossly intact Eyes: Sclera non-icteric. Conjunctiva clear Neck: Supple. Trachea midline Pulmonary:  Good air movement, no use of accessory muscles, respirations not labored.  Cardiac: RRR, No JVD Vascular: Varicosities scattered and measuring up to 1-2 mm in the right lower extremity        Varicosities diffuse and measuring up to 2-3 mm in the left lower extremity Vessel Right Left  Radial Palpable Palpable                          PT Palpable Palpable  DP Palpable Palpable    Musculoskeletal: M/S 5/5 throughout.   No RLE edema.  1+ LLE edema Neurologic: Sensation grossly intact in extremities.  Symmetrical.  Speech is fluent.  Psychiatric: Judgment intact, Mood & affect appropriate for pt's clinical situation. Dermatologic: No rashes or ulcers noted.  No cellulitis or open wounds.  Radiology No results found.  Labs No results found for this or any previous visit (from the past 2160 hour(s)).  Assessment/Plan:  Borderline diabetes mellitus blood glucose control important in reducing the progression of atherosclerotic disease. Also, involved in wound healing.    Varicose veins of leg with pain, left   The patient has done their best to comply with conservative therapy of 20-30 mm Hg compression stockings, leg elevation, exercise, and anti-inflammatories as needed for discomfort.  Despite this, they continue to have daily and persistent symptoms from their venous disease.  A venous reflux study demonstrates no DVT or superficial  thrombophlebitis but left great saphenous vein as well as left small saphenous vein reflux were seen.  As such, the patient is likely to benefit from endovenous laser ablation of the left great saphenous vein.  If she still has symptoms following that, consideration for left small saphenous vein laser ablation would also be given.  Risks and benefits of the procedure including bleeding, infection, recanalization, DVT, and need for further therapy for residual varicosities were discussed.  The patient voices their understanding and is agreeable to proceed with left great saphenous vein laser ablation.     2161 11/13/2019, 3:26 PM

## 2019-11-13 NOTE — Patient Instructions (Signed)
Procedimientos no quirrgicos para vrices Nonsurgical Procedures for Varicose Veins Existen diversos procedimientos no quirrgicos para tratar las vrices. Las vrices son venas hinchadas y retorcidas que se ven debajo de la piel. Generalmente se encuentran en las piernas. Estas venas pueden verse azules y abultadas. Las vrices son causadas por el dao en las [place] venas. Todas las venas tienen una vlvula que mantiene la sangre circulando en una nica direccin. Si una vlvula se debilita o se daa, la sangre puede acumularse y causar la formacin de vrices. Usted quizs necesite un procedimiento para tratar las vrices si le provocan sntomas o complicaciones, o si los cambios en el estilo de vida no han sido de [place]. Estos procedimientos pueden Best boy y las [place], y disminuir el riesgo de hemorragias y cogulos sanguneos. Tambin pueden mejorar el aspecto de la zona afectada (aspecto esttico). Los tres procedimientos no quirrgicos ms frecuentes son los siguientes:  Public affairs consultant. Se inyecta una sustancia qumica para cerrar la vena.  Tratamiento con lser. Se aplica energa lumnica para cerrar la vena.  Ablacin venosa por radiofrecuencia. Se [place] energa elctrica para producir calor que cierra la vena. El mdico analizar el mtodo ms adecuado para usted en funcin de su afeccin. Informe al mdico acerca de lo siguiente:  Cualquier alergia que tenga.  Todos los [employer], incluidos vitaminas, hierbas, gotas oftlmicas, cremas y medicamentos de venta libre.  Cualquier problema previo que usted o algn miembro de su familia hayan tenido con los anestsicos.  Cualquier trastorno de la sangre que tenga.  Cirugas a las que se haya sometido.  Cualquier afeccin mdica que tenga.  Si est embarazada o podra estarlo. Cules son los riesgos? En general, se trata de un procedimiento seguro. Sin embargo, pueden ocurrir complicaciones, por  ejemplo:  [place]ao a las venas, los nervios o los tejidos cercanos.  Irritacin de la piel, llagas o manchas oscuras.  Adormecimiento.  Formacin de cogulos.  Infeccin.  Reacciones alrgicas a los medicamentos.  Formacin de cicatrices.  [place].  Necesidad de someterse a ms tratamientos.  [place]. Qu ocurre antes del procedimiento?  Consulte al mdico sobre: ? Quarry manager o suspender los medicamentos que toma habitualmente. Esto es muy importante si toma medicamentos para la diabetes o anticoagulantes. ? Tomar medicamentos de [employer], vitaminas, hierbas y suplementos. ? Tomar medicamentos como aspirina e ibuprofeno. Estos medicamentos pueden tener un efecto anticoagulante en la [place]. No tome estos medicamentos a menos que el mdico se lo indique.  Pueden hacerle un examen o anlisis. Pueden ser para lo siguiente: ? Detectar la presencia de cogulos y Aeronautical engineer circulacin sangunea mediante el uso de ondas de sonido (ecografa Doppler). ? Observar cmo circula la sangre a travs de las venas al [employer] un tinte que permite visualizar las venas en radiografas (angiograma). Este estudio se [place] en contadas ocasiones. Qu ocurre durante el procedimiento? Se realizar uno de los siguientes procedimientos: Escleroterapia A menudo, este procedimiento se [place] para las venas pequeas a medianas.  Se inyectar en la vena una sustancia qumica (esclerosante) que irrita la pared venosa. Esto tendr como resultado la oclusin de la vrice. Pueden usarse esclerosantes en diferentes cantidades y dosis, en funcin del tamao y la ubicacin de la vena.  Estas sustancias se inyectarn en todas las vrices. Quizs se necesite ms de un tratamiento porque pueden formarse otras vrices o se deba aplicar ms de una inyeccin en cada una de las vrices.  Tratamiento con lser El lser  se puede usar de [employer] para tratar las vrices:  La energa lumnica del  lser se puede apuntar a la vena, a travs de la piel.  Se puede usar una aguja para pasar un catter lser delgado por la vena, y lograr que se cierre. Si la vena se vuelve a abrir, usted tal vez deba someterse a ms de Pharmacist, community. En algunos casos, el tratamiento con lser puede combinarse con escleroterapia. Ablacin de la vena por radiofrecuencia   Le administrarn un medicamento para adormecer la zona (anestesia local).  Se har una pequea incisin cerca de la vrice.  Se introducir un tubo delgado (catter) en la vena.  El catter tiene [employer] extremo.  Estos electrodos emitirn energa elctrica para producir calor, el cual cerrar la vena. Qu ocurre despus del procedimiento?  Se puede usar una venda (vendaje) para cubrir las incisiones o los lugares de las inyecciones.  Quizs [employer] de compresin. Estas medias ayudan a Transport planner formacin de cogulos de [place] y a reducir la hinchazn de las piernas.  Retome sus actividades normales segn lo indicado por el mdico. Resumen  Las vrices son venas hinchadas y retorcidas que se ven debajo de la piel. Generalmente se encuentran en las piernas.  Existen diversos procedimientos para tratarlas. Usted quizs necesite un procedimiento para tratar las vrices si le provocan sntomas o complicaciones, o si los cambios en el estilo de vida no han sido de [place].  El mdico analizar el mtodo ms adecuado para usted en funcin de su afeccin. Esta informacin no tiene Theme park manager el consejo del mdico. Asegrese de hacerle al mdico cualquier pregunta que tenga. Document Revised: 10/02/2018 Document Reviewed: 06/13/2017 Elsevier Patient Education  2020 [employer].

## 2019-12-06 ENCOUNTER — Encounter: Payer: Self-pay | Admitting: Internal Medicine

## 2019-12-06 ENCOUNTER — Ambulatory Visit (INDEPENDENT_AMBULATORY_CARE_PROVIDER_SITE_OTHER): Payer: No Typology Code available for payment source | Admitting: Internal Medicine

## 2019-12-06 ENCOUNTER — Telehealth: Payer: Self-pay | Admitting: Internal Medicine

## 2019-12-06 ENCOUNTER — Ambulatory Visit: Payer: No Typology Code available for payment source | Admitting: Family Medicine

## 2019-12-06 ENCOUNTER — Other Ambulatory Visit: Payer: Self-pay

## 2019-12-06 ENCOUNTER — Other Ambulatory Visit: Payer: Self-pay | Admitting: Internal Medicine

## 2019-12-06 VITALS — BP 120/82 | HR 61 | Temp 97.2°F | Ht 60.0 in | Wt 150.0 lb

## 2019-12-06 DIAGNOSIS — R937 Abnormal findings on diagnostic imaging of other parts of musculoskeletal system: Secondary | ICD-10-CM

## 2019-12-06 DIAGNOSIS — Z13220 Encounter for screening for lipoid disorders: Secondary | ICD-10-CM

## 2019-12-06 DIAGNOSIS — R109 Unspecified abdominal pain: Secondary | ICD-10-CM

## 2019-12-06 DIAGNOSIS — K5900 Constipation, unspecified: Secondary | ICD-10-CM | POA: Diagnosis not present

## 2019-12-06 DIAGNOSIS — E559 Vitamin D deficiency, unspecified: Secondary | ICD-10-CM | POA: Diagnosis not present

## 2019-12-06 DIAGNOSIS — Z1211 Encounter for screening for malignant neoplasm of colon: Secondary | ICD-10-CM | POA: Diagnosis not present

## 2019-12-06 DIAGNOSIS — N3000 Acute cystitis without hematuria: Secondary | ICD-10-CM

## 2019-12-06 DIAGNOSIS — Z1231 Encounter for screening mammogram for malignant neoplasm of breast: Secondary | ICD-10-CM

## 2019-12-06 DIAGNOSIS — M5416 Radiculopathy, lumbar region: Secondary | ICD-10-CM | POA: Diagnosis not present

## 2019-12-06 DIAGNOSIS — E2839 Other primary ovarian failure: Secondary | ICD-10-CM

## 2019-12-06 DIAGNOSIS — N952 Postmenopausal atrophic vaginitis: Secondary | ICD-10-CM

## 2019-12-06 DIAGNOSIS — M48061 Spinal stenosis, lumbar region without neurogenic claudication: Secondary | ICD-10-CM

## 2019-12-06 DIAGNOSIS — R39198 Other difficulties with micturition: Secondary | ICD-10-CM

## 2019-12-06 DIAGNOSIS — E039 Hypothyroidism, unspecified: Secondary | ICD-10-CM

## 2019-12-06 LAB — CBC WITH DIFFERENTIAL/PLATELET
Basophils Absolute: 0 10*3/uL (ref 0.0–0.1)
Basophils Relative: 0.2 % (ref 0.0–3.0)
Eosinophils Absolute: 0.1 10*3/uL (ref 0.0–0.7)
Eosinophils Relative: 1.4 % (ref 0.0–5.0)
HCT: 40.3 % (ref 36.0–46.0)
Hemoglobin: 13.7 g/dL (ref 12.0–15.0)
Lymphocytes Relative: 35.7 % (ref 12.0–46.0)
Lymphs Abs: 2 10*3/uL (ref 0.7–4.0)
MCHC: 33.9 g/dL (ref 30.0–36.0)
MCV: 90.5 fl (ref 78.0–100.0)
Monocytes Absolute: 0.3 10*3/uL (ref 0.1–1.0)
Monocytes Relative: 4.5 % (ref 3.0–12.0)
Neutro Abs: 3.3 10*3/uL (ref 1.4–7.7)
Neutrophils Relative %: 58.2 % (ref 43.0–77.0)
Platelets: 223 10*3/uL (ref 150.0–400.0)
RBC: 4.46 Mil/uL (ref 3.87–5.11)
RDW: 13.1 % (ref 11.5–15.5)
WBC: 5.6 10*3/uL (ref 4.0–10.5)

## 2019-12-06 LAB — LIPID PANEL
Cholesterol: 213 mg/dL — ABNORMAL HIGH (ref 0–200)
HDL: 39.3 mg/dL (ref >39.00)
NonHDL: 173.81
Total CHOL/HDL Ratio: 5
Triglycerides: 205 mg/dL — ABNORMAL HIGH (ref 0.0–149.0)
VLDL: 41 mg/dL — ABNORMAL HIGH (ref 0.0–40.0)

## 2019-12-06 LAB — COMPREHENSIVE METABOLIC PANEL
ALT: 19 U/L (ref 0–35)
AST: 24 U/L (ref 0–37)
Albumin: 4 g/dL (ref 3.5–5.2)
Alkaline Phosphatase: 123 U/L — ABNORMAL HIGH (ref 39–117)
BUN: 15 mg/dL (ref 6–23)
CO2: 30 mEq/L (ref 19–32)
Calcium: 10.3 mg/dL (ref 8.4–10.5)
Chloride: 105 mEq/L (ref 96–112)
Creatinine, Ser: 0.7 mg/dL (ref 0.40–1.20)
GFR: 83.83 mL/min (ref >60.00)
Glucose, Bld: 91 mg/dL (ref 70–99)
Potassium: 4 mEq/L (ref 3.5–5.1)
Sodium: 140 mEq/L (ref 135–145)
Total Bilirubin: 0.5 mg/dL (ref 0.2–1.2)
Total Protein: 7.1 g/dL (ref 6.0–8.3)

## 2019-12-06 LAB — LDL CHOLESTEROL, DIRECT: Direct LDL: 115 mg/dL

## 2019-12-06 LAB — TSH: TSH: 3.01 u[IU]/mL (ref 0.35–4.50)

## 2019-12-06 LAB — VITAMIN D 25 HYDROXY (VIT D DEFICIENCY, FRACTURES): VITD: 58 ng/mL (ref 30.00–100.00)

## 2019-12-06 MED ORDER — LEVOTHYROXINE SODIUM 25 MCG PO TABS: 25.0000 ug | ORAL_TABLET | Freq: Every day | ORAL | 3 refills | Status: DC

## 2019-12-06 MED ORDER — ESTRADIOL 0.1 MG/GM VA CREA: 1.0000 | TOPICAL_CREAM | VAGINAL | 5 refills | Status: DC

## 2019-12-06 NOTE — Progress Notes (Signed)
Chief Complaint  Patient presents with  . Thyroid Problem    Patient states she has a thyroid work up every 6 months. Patient would like to discuss if she is hypo or hyper thyroid and what this means. She states she has no symptoms but was told she will have this for life.   . Urine Output    Patient has trouble voiding unless she leans fowards  . Constipation    Patient has trouble voiding unless she leans backwards  . Back Pain    Mid to lower back pain 6/10 pain score. Pain around kidney area started 3 weeks ago.    F/u with interpreter  1. C/o trouble using the bathroom urinating and has to lean forward and backward to get the urine out agreeable to see urology Onset of sx's new  2. C/o right lower abdomen pain  3. H/o fall x 1 and low back pain 6/10 x 3 weeks worse no h/o trauma other than fall  4. C/o constipation and hard to start having a bowel movement at times and stool is dry rec increase water intake  5. Co vaginal dryness and wants refill of estrace cream  6. Hypothyroidism on levothyroxine 25 mcg dose needs refill sent to [facility] 7. H/o varicose veins Dr. Wyn Quaker wants Rx compression stockings can get these from Dr. Wyn Quaker vascular appt 01/11/20 and 01/14/20  Review of Systems  Constitutional: Negative for weight loss.  HENT: Negative for hearing loss.   Eyes: Negative for blurred vision.  Respiratory: Negative for shortness of breath.   Cardiovascular: Negative for chest pain.  Gastrointestinal: Positive for abdominal pain and constipation.  Genitourinary:       +trouble urinating   Musculoskeletal: Positive for back pain and falls.  Skin: Negative for rash.  Neurological: Negative for headaches.  Psychiatric/Behavioral: Negative for depression and memory loss.   Past Medical History:  Diagnosis Date  . Thyroid cyst   . Thyroid disease    Past Surgical History:  Procedure Laterality Date  . BREAST BIOPSY Left 2004   neg  . BREAST EXCISIONAL BIOPSY Right 06/22/2012   neg/re-excision  . BREAST EXCISIONAL BIOPSY Right 01/03/2012   neg phyllodes tumor w/ margin involvment   Family History  Problem Relation Age of Onset  . Breast cancer Neg Hx    Social History   Socioeconomic History  . Marital status: Divorced    Spouse name: Not on file  . Number of children: Not on file  . Years of education: Not on file  . Highest education level: Not on file  Occupational History  . Not on file  Tobacco Use  . Smoking status: Never Smoker  . Smokeless tobacco: Never Used  Substance and Sexual Activity  . Alcohol use: Not on file  . Drug use: Never  . Sexual activity: Not on file  Other Topics Concern  . Not on file  Social History Narrative  . Not on file   Social Determinants of Health   Financial Resource Strain:   . Difficulty of Paying Living Expenses:   Food Insecurity:   . Worried About Programme researcher, broadcasting/film/video in the Last Year:   . Barista in the Last Year:   Transportation Needs:   . Freight forwarder (Medical):   Marland Kitchen Lack of Transportation (Non-Medical):   Physical Activity:   . Days of Exercise per Week:   . Minutes of Exercise per Session:   Stress:   . Feeling of  Stress :   Social Connections:   . Frequency of Communication with Friends and Family:   . Frequency of Social Gatherings with Friends and Family:   . Attends Religious Services:   . Active Member of Clubs or Organizations:   . Attends Banker Meetings:   Marland Kitchen Marital Status:   Intimate Partner Violence:   . Fear of Current or Ex-Partner:   . Emotionally Abused:   Marland Kitchen Physically Abused:   . Sexually Abused:    Current Meds  Medication Sig  . Cholecalciferol (D-3-5) 125 MCG (5000 UT) capsule Take 5,000 Units by mouth daily.  Marland Kitchen estradiol (ESTRACE) 0.1 MG/GM vaginal cream Place 1 Applicatorful vaginally 3 (three) times a week. 1-3 x per week  . levothyroxine (SYNTHROID) 25 MCG tablet Take 1 tablet (25 mcg total) by mouth daily before breakfast. 30 min  before food d/c 112 mcg  . Omega-3 Fatty Acids (OMEGA 3 500 PO) Take by mouth daily.  . [DISCONTINUED] estradiol (ESTRACE) 0.1 MG/GM vaginal cream Place 1 Applicatorful vaginally daily.  . [DISCONTINUED] levothyroxine (SYNTHROID, LEVOTHROID) 25 MCG tablet Take 1 tablet (25 mcg total) by mouth daily before breakfast. (Patient taking differently: Take 112 mcg by mouth daily before breakfast. )   No Known Allergies Recent Results (from the past 2160 hour(s))  Comprehensive metabolic panel     Status: Abnormal   Collection Time: 12/06/19  9:49 AM  Result Value Ref Range   Sodium 140 135 - 145 mEq/L   Potassium 4.0 3.5 - 5.1 mEq/L   Chloride 105 96 - 112 mEq/L   CO2 30 19 - 32 mEq/L   Glucose, Bld 91 70 - 99 mg/dL   BUN 15 6 - 23 mg/dL   Creatinine, Ser 8.18 0.40 - 1.20 mg/dL   Total Bilirubin 0.5 0.2 - 1.2 mg/dL   Alkaline Phosphatase 123 (H) 39 - 117 U/L   AST 24 0 - 37 U/L   ALT 19 0 - 35 U/L   Total Protein 7.1 6.0 - 8.3 g/dL   Albumin 4.0 3.5 - 5.2 g/dL   GFR 56.31 >49.70 mL/min   Calcium 10.3 8.4 - 10.5 mg/dL  CBC with Differential/Platelet     Status: None   Collection Time: 12/06/19  9:49 AM  Result Value Ref Range   WBC 5.6 4.0 - 10.5 K/uL   RBC 4.46 3.87 - 5.11 Mil/uL   Hemoglobin 13.7 12.0 - 15.0 g/dL   HCT 26.3 78.5 - 88.5 %   MCV 90.5 78.0 - 100.0 fl   MCHC 33.9 30.0 - 36.0 g/dL   RDW 02.7 74.1 - 28.7 %   Platelets 223.0 150.0 - 400.0 K/uL   Neutrophils Relative % 58.2 43.0 - 77.0 %   Lymphocytes Relative 35.7 12.0 - 46.0 %   Monocytes Relative 4.5 3.0 - 12.0 %   Eosinophils Relative 1.4 0.0 - 5.0 %   Basophils Relative 0.2 0.0 - 3.0 %   Neutro Abs 3.3 1.4 - 7.7 K/uL   Lymphs Abs 2.0 0.7 - 4.0 K/uL   Monocytes Absolute 0.3 0.1 - 1.0 K/uL   Eosinophils Absolute 0.1 0.0 - 0.7 K/uL   Basophils Absolute 0.0 0.0 - 0.1 K/uL  Lipid panel     Status: Abnormal   Collection Time: 12/06/19  9:49 AM  Result Value Ref Range   Cholesterol 213 (H) 0 - 200 mg/dL    Comment:  ATP III Classification       Desirable:  < 200 mg/dL  Borderline High:  200 - 239 mg/dL          High:  > = 240 mg/dL   Triglycerides 205.0 (H) 0.0 - 149.0 mg/dL    Comment: Normal:  <150 mg/dLBorderline High:  150 - 199 mg/dL   HDL 39.30 >39.00 mg/dL   VLDL 41.0 (H) 0.0 - 40.0 mg/dL   Total CHOL/HDL Ratio 5     Comment:                Men          Women1/2 Average Risk     3.4          3.3Average Risk          5.0          4.42X Average Risk          9.6          7.13X Average Risk          15.0          11.0                       NonHDL 173.81     Comment: NOTE:  Non-HDL goal should be 30 mg/dL higher than patient's LDL goal (i.e. LDL goal of < 70 mg/dL, would have non-HDL goal of < 100 mg/dL)  TSH     Status: None   Collection Time: 12/06/19  9:49 AM  Result Value Ref Range   TSH 3.01 0.35 - 4.50 uIU/mL  Urinalysis, Routine w reflex microscopic     Status: None   Collection Time: 12/06/19  9:49 AM  Result Value Ref Range   Color, Urine YELLOW YELLOW   APPearance CLEAR CLEAR   Specific Gravity, Urine 1.009 1.001 - 1.03   pH 7.0 5.0 - 8.0   Glucose, UA NEGATIVE NEGATIVE   Bilirubin Urine NEGATIVE NEGATIVE   Ketones, ur NEGATIVE NEGATIVE   Hgb urine dipstick NEGATIVE NEGATIVE   Protein, ur NEGATIVE NEGATIVE   Nitrite NEGATIVE NEGATIVE   Leukocytes,Ua NEGATIVE NEGATIVE  Urine Culture     Status: None   Collection Time: 12/06/19  9:49 AM   Specimen: Urine  Result Value Ref Range   MICRO NUMBER: [id number]    SPECIMEN QUALITY: Adequate    Sample Source NOT GIVEN    STATUS: FINAL    Result: No Growth   Vitamin D (25 hydroxy)     Status: None   Collection Time: 12/06/19  9:49 AM  Result Value Ref Range   VITD 58.00 30.00 - 100.00 ng/mL  LDL cholesterol, direct     Status: None   Collection Time: 12/06/19  9:49 AM  Result Value Ref Range   Direct LDL 115.0 mg/dL    Comment: Optimal:  <100 mg/dLNear or Above Optimal:  100-129 mg/dLBorderline High:  130-159 mg/dLHigh:   160-189 mg/dLVery High:  >190 mg/dL  Urinalysis, Complete     Status: None   Collection Time: 01/09/20  3:36 PM  Result Value Ref Range   Specific Gravity, UA 1.010 1.005 - 1.030   pH, UA 7.0 5.0 - 7.5   Color, UA Straw Yellow   Appearance Ur Clear Clear   Leukocytes,UA Negative Negative   Protein,UA Negative Negative/Trace   Glucose, UA Negative Negative   Ketones, UA Negative Negative   RBC, UA Negative Negative   Bilirubin, UA Negative Negative   Urobilinogen, Ur 0.2 0.2 - 1.0 mg/dL   Nitrite, UA Negative  Negative   Microscopic Examination See below:   Microscopic Examination     Status: None   Collection Time: 01/09/20  3:36 PM   URINE  Result Value Ref Range   WBC, UA 0-5 0 - 5 /hpf   RBC None seen 0 - 2 /hpf   Epithelial Cells (non renal) None seen 0 - 10 /hpf   Bacteria, UA None seen None seen/Few  BLADDER SCAN AMB NON-IMAGING     Status: None   Collection Time: 01/09/20  3:45 PM  Result Value Ref Range   Scan Result 77 ML    Objective  Body mass index is 29.29 kg/m. Wt Readings from Last 3 Encounters:  12/06/19 150 lb (68 kg)   Temp Readings from Last 3 Encounters:  12/06/19 (!) 97.2 F (36.2 C) (Temporal)  06/05/19 (!) 96.5 F (35.8 C) (Temporal)  11/28/18 98 F (36.7 C) (Oral)   BP Readings from Last 3 Encounters:  12/06/19 120/82   Pulse Readings from Last 3 Encounters:  12/06/19 61    Physical Exam Vitals and nursing note reviewed.  Constitutional:      Appearance: Normal appearance. She is well-developed, well-groomed and overweight.  HENT:     Head: Normocephalic and atraumatic.  Eyes:     Conjunctiva/sclera: Conjunctivae normal.     Pupils: Pupils are equal, round, and reactive to light.  Cardiovascular:     Rate and Rhythm: Normal rate and regular rhythm.     Heart sounds: Normal heart sounds. No murmur.     Comments: Varicose veins legs b/l Pulmonary:     Effort: Pulmonary effort is normal.     Breath sounds: Normal breath sounds.   Abdominal:     General: Abdomen is flat. Bowel sounds are normal.     Tenderness: There is abdominal tenderness in the right lower quadrant.  Musculoskeletal:       Arms:  Skin:    General: Skin is warm and dry.  Neurological:     General: No focal deficit present.     Mental Status: She is alert and oriented to person, place, and time. Mental status is at baseline.     Gait: Gait normal.  Psychiatric:        Attention and Perception: Attention and perception normal.        Mood and Affect: Mood and affect normal.        Speech: Speech normal.        Behavior: Behavior normal. Behavior is cooperative.        Thought Content: Thought content normal.        Cognition and Memory: Cognition and memory normal.        Judgment: Judgment normal.     Assessment  Plan  Abnormality of urination - Plan: Ambulatory referral to Urology,  Urinalysis, Routine w reflex microscopic, Urine Culture  Constipation, unspecified constipation type -  F/u with GI if due  rec miralax otc  Increase water 64 oz qd   Lumbar radiculopathy - Plan: MR Lumbar Spine Wo Contrast, Comprehensive metabolic panel, CBC with Differential/Platelet MRI abnormal 12/30/19 refer Dr. Yves Dillhasnis Needs spanish interpreter Disc bulging, arthritis changes and pressing on L5 nerve root with narrowing of spinal canal or spinal stenosis mild to moderate  steroid injections to help with symptoms  Hypothyroidism, unspecified type - Plan: Comprehensive metabolic panel, CBC with Differential/Platelet, TSH, levothyroxine (SYNTHROID) 25 MCG tablet  Vitamin D deficiency - Plan: Vitamin D (25 hydroxy)  Abdominal pain, unspecified abdominal location -  Plan: US Abdomen Complete 12/31/19 negative could be 2/2 constipation  Vaginal atrophy - Plan: estradiol (ESTRACE) 0.1 MG/GM vaginal cream   HM Flu utd 06/2019  Tdap, prevnar, pna 23, shingrix consider in future if not had  covid had 2/2 moderna 09/2019 and 11/02/19   mammo 04/17/2019 neg  Pap 11/28/2018 negative  S/p hysterectomy out of age window Colonoscopy/EGD due in 2023 [facility] GI had 09/09/12 hyperplastic sessile polyp IH/EGD normal dilated gastric polyp DEXA ordered  Hep C neg HIV neg  Provider: Dr. French Ana McLean-Scocuzza-Internal Medicine

## 2019-12-06 NOTE — Telephone Encounter (Signed)
Pt wants Rx for compression stockings  Can you help  For varicose veins   Thanks tMS

## 2019-12-06 NOTE — Patient Instructions (Addendum)
If constipated try Miralax over the counter daily as needed   Hipotiroidismo Hypothyroidism  El hipotiroidismo ocurre cuando la glndula tiroidea no produce la cantidad suficiente de ciertas hormonas (es hipoactiva). La glndula tiroidea es una pequea glndula ubicada en la parte delantera inferior del cuello, justo delante de la trquea. Esta glndula produce hormonas que ayudan a Scientist, physiological forma en la que el cuerpo [place] los alimentos para obtener energa (metabolismo) as como tambin la funcin cardaca y la funcin cerebral. Estas hormonas tambin juegan un papel para [employer] huesos fuertes. Cuando la tiroides es hipoactiva, produce muy poca cantidad de las hormonas tiroxina (T4) y triyodotironina (T3). Cules son las causas? Esta afeccin puede ser causada por lo siguiente:  Enfermedad de Hashimoto. Se trata de una enfermedad por la cual el sistema del cuerpo encargado de combatir las enfermedades (sistema inmunitario) ataca la glndula tiroidea. Esta es la causa ms frecuente.  Infecciones virales.  Embarazo.  Ciertos medicamentos.  Defectos congnitos.  Radioterapias anteriores en la cabeza o el cuello para Management consultant.  Tratamiento previo con yodo radioactivo.  Exposicin a la radiacin en el ambiente, en el pasado.  Extirpacin quirrgica previa de una parte o de toda la tiroides.  Problemas con Neomia Dear glndula ubicada en el centro del cerebro (hipfisis).  Falta de una cantidad suficiente de yodo en la dieta. Qu incrementa el riesgo? Es ms probable que usted sufra esta afeccin si:  Es mujer.  Tiene antecedentes familiares de afecciones tiroideas.  [place] un medicamento denominado litio.  Toma medicamentos que afectan el sistema inmunitario (inmunosupresores). Cules son los signos o los sntomas? Los sntomas de esta afeccin [employer] siguientes:  Sensacin de falta de energa ([place]).  Incapacidad para tolerar el fro.  Aumento de peso que no  puede explicarse por un cambio en la dieta o en los hbitos de ejercicio fsico.  Falta de apetito.  Piel seca.  Pelo grueso.  Irregularidades menstruales.  Ralentizacin de los procesos de pensamiento.  Estreimiento.  Tristeza o depresin. Cmo se diagnostica? Esta afeccin se puede diagnosticar en funcin de lo siguiente:  Los sntomas, sus antecedentes mdicos y un examen fsico.  Anlisis de sangre. Tambin puede someterse a estudios por imgenes como una ecografa o una resonancia magntica (RM). Cmo se trata? Esta afeccin se trata con medicamentos que reemplazan las hormonas tiroideas que el cuerpo no produce. Despus de Microbiologist, pueden pasar varias semanas hasta la desaparicin de los sntomas. Siga estas indicaciones en su casa:  Tome los medicamentos de venta libre y los recetados solamente como se lo haya indicado el mdico.  Si empieza a tomar medicamentos nuevos, infrmele al mdico.  Oceanographer a todas las visitas de control como se lo haya indicado el mdico. Esto es importante. ? A medida que la afeccin mejora, es posible que haya que modificar las dosis de los medicamentos de hormona tiroidea. ? Tendr que hacerse anlisis de sangre peridicamente, de modo que el mdico pueda Passenger transport manager. Comunquese con un mdico si:  Los sntomas no mejoran con Scientist, research (medical).  Est tomando medicamentos de reemplazo de la tiroides y: ? Wendall Stade. ? Tiene temblores. ? Se siente ansioso. ? [place] de peso rpidamente. ? No puede Patent examiner. ? Tiene cambios emocionales. ? Tiene diarrea. ? Se siente dbil. Solicite ayuda inmediatamente si tiene:  Journalist, newspaper.  Latidos cardacos irregulares.  Latidos cardacos rpidos.  Dificultad para respirar. Resumen  El hipotiroidismo ocurre cuando la glndula tiroidea no produce  la cantidad suficiente de ciertas hormonas (es hipoactiva).  Cuando la tiroides es hipoactiva, produce muy  poca cantidad de las hormonas tiroxina (T4) y triyodotironina (T3).  La causa ms frecuente es la enfermedad de Hashimoto, una enfermedad por la cual el sistema del cuerpo encargado de combatir las enfermedades (sistema inmunitario) ataca la glndula tiroidea. La afeccin tambin puede ser consecuencia de infecciones virales, medicamentos, el embarazo o luego de un tratamiento de radioterapia en la cabeza o el cuello.  Los sntomas pueden incluir aumento de Seven Oakspeso, piel seca, estreimiento, sensacin de no tener energa e incapacidad para SCANA Corporationtolerar el fro.  Esta afeccin se trata con medicamentos que reemplazan las hormonas tiroideas que el cuerpo no produce. Esta informacin no tiene Theme park managercomo fin reemplazar el consejo del mdico. Asegrese de hacerle al mdico cualquier pregunta que tenga. Document Revised: 12/24/2017 Document Reviewed: 10/12/2017 Elsevier Patient Education  2020 Elsevier Inc.  Rehabilitacin de esguince o distensin de la parte inferior de la espalda Low Back Sprain or Strain Rehab Pregunte al mdico qu ejercicios son seguros para usted. Haga los ejercicios exactamente como se lo haya indicado el mdico y gradelos como se lo hayan indicado. Es normal sentir un estiramiento leve, tironeo, opresin o Dentistmalestar al Manpower Inchacer estos ejercicios. Detngase de inmediato si siente un dolor repentino o Community education officerel dolor empeora. No comience a hacer estos ejercicios hasta que se lo indique el mdico. Ejercicios de elongacin y amplitud de movimiento Estos ejercicios calientan los msculos y las articulaciones, y mejoran el movimiento y la flexibilidad de la espalda. Estos ejercicios tambin ayudan a Engineer, materialsaliviar el dolor, el adormecimiento y el hormigueo. Rotacin lumbar  1. Acustese boca arriba sobre un superficie firme y flexione las rodillas. 2. Coloque los brazos extendidos a los lados de modo que cada brazo forme un ngulo de 90grados (ngulo recto) con respecto al cuerpo. 3. Mueva lentamente (rote) ambas  rodillas hacia un lado del cuerpo hasta que sienta un estiramiento en la parte inferior de la espalda (zona lumbar). Trate de no levantar los hombros del piso. 4. Mantenga esta posicin durante __________ segundos. 5. Tensione los msculos abdominales y lentamente lleve las rodillas a la posicin inicial. 6. Repita este ejercicio del otro lado del cuerpo. Repita __________ veces. Realice este ejercicio __________ veces al da. Rodilla al pecho  1. Acustese boca arriba en una superficie firme con las piernas extendidas. 2. Flexione una rodilla. Tome la rodilla con las manos y llvela hacia el pecho hasta que sienta un estiramiento suave en la parte inferior de la espalda y las nalgas. ? Mantenga la pierna en esta posicin tomando la parte frontal de la rodilla. ? Mantenga la otra pierna lo ms extendida posible. 3. Mantenga esta posicin durante __________ segundos. 4. Vuelva lentamente a la posicin inicial. 5. Repita el ejercicio con la otra pierna. Repita __________ veces. Realice este ejercicio __________ veces al da. Extensin United Stationerssobre los codos, en decbito prono  1. Recustese boca abajo sobre una superficie firme (posicin prona). 2. Apyese sobre los codos. 3. Con los brazos, aydese a Haematologistlevantar el pecho hasta sentir un leve estiramiento en el abdomen y la parte inferior de la espalda. ? Arts administratorsto colocar algo de Autolivpeso corporal sobre los codos. Si no se siente cmodo, intente colocando almohadas debajo del pecho. ? Debe dejar la cadera inmvil sobre la superficie en la que est apoyado. Mantenga la cadera y los msculos de la espalda relajados. 4. Mantenga esta posicin durante __________ segundos. 5. Afloje lentamente la parte superior del cuerpo y  vuelva a la posicin inicial. Repita __________ veces. Realice este ejercicio __________ veces al da. Ejercicios de fortalecimiento Estos ejercicios fortalecen la espalda y le otorgan resistencia. La resistencia es la capacidad de usar los  msculos durante un tiempo prolongado, incluso despus de que se cansen. Inclinacin de la pelvis Este ejercicio fortalece los msculos que se encuentran en la parte profunda del abdomen. 1. Acustese boca arriba sobre una superficie firme. Doble las rodillas y [employer] pies planos sobre el piso. 2. Tensione los msculos abdominales. Eleve la pelvis hacia el techo y aplane la parte inferior de la espalda contra el suelo. ? Para realizar este ejercicio, puede colocar una toalla pequea debajo de la parte inferior de la espalda y presionar la espalda contra la toalla. 3. Mantenga esta posicin durante __________ segundos. 4. Relaje totalmente los msculos antes de repetir el ejercicio. Repita __________ veces. Realice este ejercicio __________ veces al da. Elevaciones alternadas de pierna y brazo  1. Apoye las palmas de las manos y las rodillas sobre una superficie firme. Si est sobre un suelo duro, puede usar un elemento acolchado, como una alfombrilla para ejercicios, para apoyar las rodillas. 2. Alinee los brazos y las piernas. Las manos deben estar justo debajo de los hombros, y las rodillas debajo de la cadera. 3. Eleve la pierna izquierda hacia atrs. Al mismo tiempo, eleve el brazo derecho y Associate Professor frente a usted. ? No eleve la pierna por encima de la cadera. ? No eleve el brazo por encima del hombro. ? Mantenga los msculos del abdomen y de la espalda contrados. ? Mantenga la cadera [employer] el suelo. ? No arquee la espalda. ? Mantenga el equilibrio con cuidado y no contenga la respiracin. 4. Mantenga esta posicin durante __________ segundos. 5. Vuelva lentamente a la posicin inicial. 6. Repita con su pierna derecha y su brazo izquierdo. Repita __________ veces. Realice este ejercicio __________ veces al da. Serie de abdominales con levantamiento de pierna extendida  1. Acustese boca arriba sobre una superficie firme. 2. Flexione una rodilla y [place] la otra  pierna extendida. 3. Tensione los msculos abdominales y levante la pierna extendida, a unas 4 o 6pulgadas (10 o 15cm) del suelo. 4. Mantenga apretados los msculos abdominales y sostenga esta posicin durante __________ segundos. ? No contenga la respiracin. ? No arquee la espalda. Mantngala plana contra el suelo. 5. Mantenga tensos los msculos abdominales mientras baja lentamente la pierna hasta la posicin inicial. 6. Repita el ejercicio con la otra pierna. Repita __________ veces. Realice este ejercicio __________ veces al da. Bajar una pierna con rodillas flexionadas 1. Acustese boca arriba sobre una superficie firme. 2. Apriete los msculos abdominales y [employer] del piso, uno a la vez, de modo que las rodillas y la cadera estn flexionadas en ngulos de 90grados (ngulos rectos). ? Las rodillas deben estar por encima de la cadera y las pantorrillas deben quedar paralelas al piso. 3. Con los msculos abdominales tensos y la rodilla flexionada, baje lentamente una pierna de modo que los dedos del pie toquen el suelo. 4. Levante la pierna para volver a la posicin inicial. ? No contenga la respiracin. ? No deje que la espalda se arquee. Mantenga la espalda plana contra el suelo. 5. Repita el ejercicio con la otra pierna. Repita __________ veces. Realice este ejercicio __________ veces al da. Postura y [place] corporal La buena postura y la Administrator, sports corporal saludable pueden ayudar a Acupuncturist estrs en las articulaciones y los tejidos del cuerpo. La  mecnica corporal se refiere a los movimientos y a las posiciones del cuerpo mientras realiza las actividades diarias. La postura es una parte de la Water quality scientist. Neomia Dear buena postura significa que:  La columna est en su posicin natural de curvatura en S (neutral).  Los hombros estn [employer].  La cabeza no est inclinada hacia adelante. Siga esas pautas para mejorar la postura y Regulatory affairs officer  en sus actividades diarias. De pie   Al estar de pie, mantenga la columna en la posicin neutral y los pies separados al ancho de caderas, aproximadamente. Mantenga las rodillas ligeramente flexionadas. [place], los hombros y las caderas deben estar alineados.  Cuando realice una tarea en la que deba estar de pie en el mismo sitio durante mucho tiempo, coloque un pie en un objeto estable de 2 a 4pulgadas (5a 10cm) de alto, como un taburete. Esto ayuda a que la columna mantenga una posicin neutral. Sentado   Cuando est sentado, mantenga la columna en posicin neutral y deje los pies apoyados en el suelo. Use un apoyapis, si es necesario, y [employer]Ex muslos paralelos al suelo. Evite redondear los hombros e inclinar la cabeza hacia adelante.  Cuando trabaje en un escritorio o con una computadora, el escritorio debe estar a una altura en la que las manos estn un poco ms abajo que los codos. Deslice la silla debajo del escritorio, de modo de estar lo suficientemente cerca como para mantener una buena [place].  Cuando trabaje con una computadora, coloque el monitor a una altura que le permita mirar derecho hacia adelante, sin tener que inclinar la cabeza hacia adelante o [place] atrs. Reposo  Al descansar o estar acostado, evite las posiciones que le causen ms dolor.  Si siente dolor al [employer] que exigen sentarse, inclinarse, agacharse o ponerse en cuclillas, acustese en una posicin en la que el cuerpo no deba doblarse mucho. Por ejemplo, evite acurrucarse de costado con los brazos y las rodillas cerca del pecho (posicin fetal).  Si siente dolor con las actividades que exigen estar de pie durante mucho tiempo o Furniture conservator/restorer los brazos, acustese con la columna en una posicin neutral y flexione ligeramente las rodillas. Pruebe con las siguientes posiciones: ? Teacher, music de costado con una almohada entre las rodillas. ? Acostarse boca arriba con una almohada debajo de las  rodillas. Levantar objetos   Cuando tenga que levantar un objeto, mantenga los pies separados el ancho de los hombros, como [place], y apriete los msculos abdominales.  Flexione las rodillas y la cadera, y Dietitian la columna en posicin neutral. Es importante levantarse utilizando la fuerza de las piernas, no de la espalda. No trabe las rodillas hacia afuera.  Siempre pida ayuda a otra persona para levantar objetos pesados o incmodos. Esta informacin no tiene Theme park manager el consejo del mdico. Asegrese de hacerle al mdico cualquier pregunta que tenga. Document Revised: 11/08/2018 Document Reviewed: 11/08/2018 Elsevier Patient Education  2020 [employer].  Ejercicios para la espalda Back Exercises Los siguientes ejercicios fortalecen los msculos que dan soporte al tronco y a la espalda. Adems, ayudan a mantener la flexibilidad de la zona lumbar. Hacer estos ejercicios puede ser de ayuda para evitar o Engineer, materials de espalda.  Si tiene dolor o [employer] espalda, intente hacer estos ejercicios 2 o 3veces por da, o como se lo haya indicado el mdico.  A medida que el dolor desaparece, hgalos una vez por da, pero aumente la cantidad de veces  que repite los pasos para cada ejercicio (haga ms repeticiones).  Para prevenir la recurrencia del dolor de espalda, contine haciendo estos ejercicios una vez al da o como se lo haya indicado el mdico. Haga los ejercicios exactamente como se lo haya indicado el mdico y gradelos como se lo hayan indicado. Es normal sentir un leve estiramiento, tirn, rigidez o molestia cuando haga estos ejercicios, pero debe detenerse de inmediato si siente un dolor repentino o si el dolor empeora. Ejercicios Rodilla al pecho Repita estos pasos 3 o 5veces con cada pierna: 1. Acustese boca arriba sobre una cama dura o sobre el suelo con las piernas extendidas. 2. Lleve una rodilla al pecho. La otra pierna debe quedar extendida y en  contacto con el suelo. 3. Mantenga la rodilla contra el pecho. Para lograrlo tmese la rodilla o el muslo con ambas manos y sostenga. 4. Tire de la rodilla hasta sentir una elongacin suave en la parte baja de la espalda o las nalgas. 5. Mantenga la elongacin durante 10 a 30segundos. 6. Suelte y extienda la pierna lentamente. Inclinacin de la pelvis Repita estos pasos 5 o 10veces: 1. Acustese boca arriba sobre una cama dura o sobre el suelo con las piernas extendidas. 2. Flexione las rodillas de modo que apunten al techo y los pies queden apoyados en el suelo. 3. Contraiga los msculos de la parte baja del abdomen para empujar la zona lumbar contra el suelo. Con este movimiento se inclinar la pelvis de modo que el coxis apunte hacia el techo, en lugar de apuntar a los pies o al suelo. 4. Contraiga suavemente y respire con normalidad mientras mantiene esta posicin durante 5 a 10segundos. El perro y el gato Repita estos pasos hasta que la zona lumbar se vuelva ms flexible: 1. Apoye las palmas de las manos y las rodillas sobre una superficie firme. Las manos deben estar alineadas con los hombros y las rodillas con las caderas. Puede colocarse almohadillas debajo de las rodillas para estar cmodo. 2. Deje que la cabeza cuelgue hacia el pecho. Contraiga los msculos abdominales y baje el coxis en direccin al suelo de modo que la zona lumbar se arquee como el lomo de un [place]. 3. Mantenga esta posicin durante 5segundos. 4. Lentamente, levante la cabeza, relaje los msculos abdominales y eleve el coxis de modo que apunte en direccin al techo para que la espalda forme un arco hundido como el lomo de un perro contento. 5. Mantenga esta posicin durante 5segundos.  Flexiones de brazos Repita estos pasos 5 o 10veces: 1. Acustese sobre el abdomen (boca abajo) en el piso. 2. Coloque las palmas de las manos cerca de la cabeza, separadas aproximadamente al ancho de los hombros. 3. Con  la espalda lo ms relajada posible y las caderas apoyadas en el suelo, extienda lentamente los brazos para levantar la mitad superior del cuerpo y Optometrist los hombros. No use los msculos de la espalda para elevar la parte superior del torso. Puede cambiar las manos de lugar para estar ms cmodo. 4. Mantenga esta posicin durante 5segundos mientras mantiene la espalda relajada. 5. Lentamente vuelva a la posicin horizontal.  Puentes Repita estos pasos 10veces: 1. Acustese boca arriba sobre una superficie firme. 2. Flexione las rodillas de modo que apunten al techo y los pies queden apoyados en el suelo. Los brazos deben estar planos a los costados del cuerpo, junto al cuerpo. 3. Contraiga los glteos y despegue las nalgas del suelo hasta que la cintura est casi a  la misma altura que las rodillas. Debe sentir el trabajo muscular en las nalgas y la parte de atrs de los muslos. Si no siente el esfuerzo de [employer], aleje los pies 1 o 2pulgadas (2.5 o 5centmetros) de las nalgas. 4. Mantenga esta posicin durante 3 a 5segundos. 5. Baje lentamente las caderas a la posicin inicial y relaje los glteos por completo. Si este ejercicio le resulta muy fcil, intente realizarlo con los brazos cruzados [place]. Abdominales Repita estos pasos 5 o 10veces: 1. Acustese boca arriba sobre una cama dura o sobre el suelo con las piernas extendidas. 2. Flexione las rodillas de modo que apunten al techo y los pies queden apoyados en el suelo. 3. Cruce los [employer]. 4. Baje levemente el mentn en direccin al pecho sin doblar el cuello. 5. Contraiga los msculos abdominales y con lentitud eleve el tronco (torso) lo suficiente como para Administrator los omplatos del suelo. No eleve el torso ms que eso, porque esto puede sobreexigir a la zona lumbar y no ayuda a Aeronautical engineer abdominales. 6. Regrese lentamente a la posicin inicial. Elevaciones de espalda Repita  estos pasos 5 o 10veces: 1. Acustese sobre el abdomen (boca abajo) con los brazos a los costados del cuerpo y apoye la frente en el suelo. 2. Contraiga los msculos de las piernas y las nalgas. 3. Lentamente despegue el pecho del suelo [employer] las caderas bien apoyadas en el suelo. Mantenga la nuca alineada con la curvatura de la espalda. Los ojos deben mirar al suelo. 4. Mantenga esta posicin durante 3 a 5segundos. 5. Regrese lentamente a la posicin inicial. Comunquese con un mdico si:  El dolor o las molestias en la espalda se vuelven mucho ms intensos cuando hace un ejercicio.  El dolor o las molestias en la espalda que [place], no se Runner, broadcasting/film/video en el trmino de las 2horas posteriores a Therapist, art. Si tiene alguno de Mirant, deje de [employer] ejercicios de inmediato. No vuelva a hacer los ejercicios a menos que el mdico lo autorice. Solicite ayuda inmediatamente si:  Siente un dolor sbito e intenso en la espalda. Si esto ocurre, deje de [employer] ejercicios de inmediato. No vuelva a hacer los ejercicios a menos que el mdico lo autorice. Esta informacin no tiene Marine scientist el consejo del mdico. Asegrese de hacerle al mdico cualquier pregunta que tenga. Document Revised: 07/27/2018 Document Reviewed: 07/27/2018 Elsevier Patient Education  2020 [employer].

## 2019-12-06 NOTE — Telephone Encounter (Signed)
Can you get an RX for compression socks filled out and I'll come sign

## 2019-12-06 NOTE — Telephone Encounter (Signed)
Shes coming today  Thx TMS

## 2019-12-06 NOTE — Telephone Encounter (Signed)
Rx for compression socks has been giving to patient

## 2019-12-06 NOTE — Progress Notes (Signed)
Language [employer] phone interpreter used to work patient up. In office interpreter scheduled was late to arrive. Interpreter number [id number]. In office interpreter used during the visit with the provider.

## 2019-12-07 LAB — URINALYSIS, ROUTINE W REFLEX MICROSCOPIC
Bilirubin Urine: NEGATIVE
Glucose, UA: NEGATIVE
Hgb urine dipstick: NEGATIVE
Ketones, ur: NEGATIVE
Leukocytes,Ua: NEGATIVE
Nitrite: NEGATIVE
Protein, ur: NEGATIVE
Specific Gravity, Urine: 1.009 (ref 1.001–1.03)
pH: 7 (ref 5.0–8.0)

## 2019-12-07 LAB — URINE CULTURE
MICRO NUMBER:: 10240601
Result:: NO GROWTH
SPECIMEN QUALITY:: ADEQUATE

## 2019-12-10 ENCOUNTER — Telehealth: Payer: Self-pay | Admitting: Family Medicine

## 2019-12-10 NOTE — Telephone Encounter (Signed)
Pt called returning your to get lab results

## 2019-12-11 ENCOUNTER — Encounter: Payer: Self-pay | Admitting: Family Medicine

## 2019-12-11 ENCOUNTER — Ambulatory Visit: Payer: No Typology Code available for payment source

## 2019-12-11 NOTE — Progress Notes (Signed)
Received record from [facility] and placed on Dr Shauna Hugh desk.

## 2019-12-11 NOTE — Telephone Encounter (Signed)
Pt would like a call back regarding lab results. Please call pt after 3pm if you don't call today because she is working during the day and can't answer before then.

## 2019-12-12 ENCOUNTER — Telehealth: Payer: Self-pay

## 2019-12-12 NOTE — Telephone Encounter (Signed)
Error

## 2019-12-12 NOTE — Telephone Encounter (Signed)
Tilford Pillar, CMA  12/10/2019 10:22 AM EDT    Used [employer], interpreter: [phone].  Left message to return call.   McLean-Scocuzza, Pasty Spillers, MD  12/10/2019 6:43 AM EDT    Needs interpreter  1 of liver enzymes slightly elevated doing US abdomen to check this out  Cholesterol elevated  -mail cholesterol information in spanish in epic  Vitamin D normal  Blood cts normal  TSH thyroid lab normal continue same does   Urine no UTI

## 2019-12-12 NOTE — Telephone Encounter (Signed)
Used language line [employer] to speak with patient.  Patient informed and verbalized understanding.  Scheduled for [place] 12/31/19 at 8:00 am. Confirmed this with patient.   Mailed handout in Spanish along with a copy of her lab results.

## 2019-12-15 ENCOUNTER — Ambulatory Visit: Payer: No Typology Code available for payment source

## 2019-12-26 ENCOUNTER — Encounter: Payer: Self-pay | Admitting: Internal Medicine

## 2019-12-30 ENCOUNTER — Ambulatory Visit
Admission: RE | Admit: 2019-12-30 | Discharge: 2019-12-30 | Disposition: A | Discharge status: Home / Self Care | Payer: No Typology Code available for payment source | Source: Ambulatory Visit | Attending: Internal Medicine | Admitting: Internal Medicine

## 2019-12-30 ENCOUNTER — Other Ambulatory Visit: Payer: Self-pay

## 2019-12-30 DIAGNOSIS — M5416 Radiculopathy, lumbar region: Secondary | ICD-10-CM | POA: Diagnosis not present

## 2019-12-31 ENCOUNTER — Ambulatory Visit
Admission: RE | Admit: 2019-12-31 | Discharge: 2019-12-31 | Disposition: A | Discharge status: Home / Self Care | Payer: No Typology Code available for payment source | Source: Ambulatory Visit | Attending: Internal Medicine | Admitting: Internal Medicine

## 2019-12-31 ENCOUNTER — Other Ambulatory Visit: Payer: Self-pay

## 2019-12-31 DIAGNOSIS — R109 Unspecified abdominal pain: Secondary | ICD-10-CM | POA: Diagnosis not present

## 2020-01-09 ENCOUNTER — Other Ambulatory Visit: Payer: Self-pay

## 2020-01-09 ENCOUNTER — Ambulatory Visit (INDEPENDENT_AMBULATORY_CARE_PROVIDER_SITE_OTHER): Payer: No Typology Code available for payment source | Admitting: Urology

## 2020-01-09 VITALS — BP 109/73 | HR 59 | Ht 60.0 in | Wt 148.0 lb

## 2020-01-09 DIAGNOSIS — R8290 Unspecified abnormal findings in urine: Secondary | ICD-10-CM

## 2020-01-09 DIAGNOSIS — R339 Retention of urine, unspecified: Secondary | ICD-10-CM

## 2020-01-09 DIAGNOSIS — N8111 Cystocele, midline: Secondary | ICD-10-CM | POA: Diagnosis not present

## 2020-01-09 LAB — BLADDER SCAN AMB NON-IMAGING: Scan Result: 77

## 2020-01-09 NOTE — Progress Notes (Signed)
01/09/20 1:54 PM   Ashley Goodman 10-14-1955 [id number]  Referring provider: Jodelle Green, FNP No address on file  Chief Complaint  Patient presents with  . Urinary Frequency    HPI: Ashley Goodman is a 64 y.o. F who presents today for the evaluation and management of urinary symptoms. Accompanied by interpreter, Ronnald Collum.  Urinalysis and urine culture both negative as of 12/06/19.   She reports of not being able to empty her bladder onset 6 months. She reports that she has to lean forward to empty and sometimes has to push.   She also has been struggling with constipation since onset of her back pain for which she was evaluated for with [place]. She noticed improvement in her bowel movement with dietary change/treatment. PVR 77 mL.   She experiences post void dribbling and denies urgency and frequency. Denies incontinence upon sneezing, coughing and laughing.   She also states of nocturia x 4. She only drinks water during the day and stops at 6 pm.   She feels like she has vaginal bulging however not bothersome. Not sexually active.   PMH: Past Medical History:  Diagnosis Date  . Thyroid cyst   . Thyroid disease     Surgical History: Past Surgical History:  Procedure Laterality Date  . BREAST BIOPSY Left 2004   neg  . BREAST EXCISIONAL BIOPSY Right 06/22/2012   neg/re-excision  . BREAST EXCISIONAL BIOPSY Right 01/03/2012   neg phyllodes tumor w/ margin involvment    Home Medications:  Allergies as of 01/09/2020   No Known Allergies     Medication List       Accurate as of January 09, 2020 11:59 PM. If you have any questions, ask your nurse or doctor.        D-3-5 125 MCG (5000 UT) capsule Generic drug: Cholecalciferol Take 5,000 Units by mouth daily.   estradiol 0.1 MG/GM vaginal cream Commonly known as: ESTRACE Place 1 Applicatorful vaginally 3 (three) times a week. 1-3 x per week   levothyroxine 25 MCG tablet Commonly  known as: SYNTHROID Take 1 tablet (25 mcg total) by mouth daily before breakfast. 30 min before food d/c 112 mcg   OMEGA 3 500 PO Take by mouth daily.       Allergies: No Known Allergies  Family History: Family History  Problem Relation Age of Onset  . Breast cancer Neg Hx     Social History:  reports that she has never smoked. She has never used smokeless tobacco. She reports that she does not use drugs. No history on file for alcohol.   Physical Exam: BP 109/73   Pulse (!) 59   Ht 5' (1.524 m)   Wt 148 lb (67.1 kg)   BMI 28.90 kg/m   Constitutional:  Well nourished. Alert and oriented, No acute distress. HEENT: [place] AT, moist mucus membranes.  Trachea midline, no masses. Cardiovascular: No clubbing, cyanosis, or edema. Respiratory: Normal respiratory effort, no increased work of breathing. Pelvic chaperoned by CMA: Stage 1 vs 2 cystocele w/ vasala, normal meatus, no apical or posterior prolapse. Normal external genitalia. Slightly atrophic vaginal tissue. Skin: No rashes, bruises or suspicious lesions. Neurologic: Grossly intact, no focal deficits, moving all 4 extremities. Psychiatric: Normal mood and affect.   Laboratory Data:  Lab Results  Component Value Date   CREATININE 0.70 12/06/2019    Pertinent Imaging: Results for orders placed or performed in visit on 01/09/20  Microscopic Examination   URINE  Result Value Ref Range   WBC, UA 0-5 0 - 5 /hpf   RBC None seen 0 - 2 /hpf   Epithelial Cells (non renal) None seen 0 - 10 /hpf   Bacteria, UA None seen None seen/Few  Urinalysis, Complete  Result Value Ref Range   Specific Gravity, UA 1.010 1.005 - 1.030   pH, UA 7.0 5.0 - 7.5   Color, UA Straw Yellow   Appearance Ur Clear Clear   Leukocytes,UA Negative Negative   Protein,UA Negative Negative/Trace   Glucose, UA Negative Negative   Ketones, UA Negative Negative   RBC, UA Negative Negative   Bilirubin, UA Negative Negative   Urobilinogen, Ur 0.2 0.2 -  1.0 mg/dL   Nitrite, UA Negative Negative   Microscopic Examination See below:   BLADDER SCAN AMB NON-IMAGING  Result Value Ref Range   Scan Result 77 ML    Assessment & Plan:    1. Cystocele  Stage 1/2 cystocele  Mildy symptomatic  May benefit from PT and weight loss Discussed alternative options including surgery vs pessary Offered conservative management, may benefit from seeing Dr. Sherron Monday  2. Incomplete bladder emptying  Pt reports of incomplete bladder emptying however PVR adequate  Discussed to return based on alarming symptoms including worsening pelvic organ prolapse, increased incontinence, worsened urinary symptoms and infection.   3. Constipation  Resolved with treatment/dietary change  Return if symptoms worsen or fail to improve, for with [place]di[place]id.  [facility] [address], [address] [place], [place] [address] [phone]  I, Donne Hazel, am acting as a scribe for Dr. Vanna Scotland,  I have reviewed the above documentation for accuracy and completeness, and I agree with the above.   Vanna Scotland, MD  I spent 45 total minutes on the day of the encounter including pre-visit review of the medical record, face-to-face time with the patient, and post visit ordering of labs/imaging/tests.

## 2020-01-09 NOTE — Patient Instructions (Signed)
Prolapso de los rganos de la pelvis Pelvic Organ Prolapse Un prolapso de los rganos de la pelvis es el estiramiento, la dilatacin o la cada de los rganos de la pelvis a una posicin anormal. Esto sucede cuando los msculos y tejidos que rodean y sostienen las estructuras plvicas se debilitan o estiran. El prolapso de los rganos de la pelvis puede involucrar a los siguientes rganos:  Vagina (prolapso vaginal).  tero (prolapso uterino).  Vejiga (cistocele).  Recto (rectocele).  Intestinos (enterocele). Cuando estn comprometidos rganos diferentes a la vagina, estos a menudo se protruyen hacia adentro de la vagina o hacia afuera de la vagina, segn la gravedad del prolapso. Cules son las causas? Esta afeccin puede ser causada por lo siguiente:  Embarazo, trabajo de parto y parto.  Ciruga de pelvis anterior.  Una disminucin de la produccin de estrgeno debido a la menopausia.  Levantar sistemticamente objetos que pesan ms de 50libras (23kg).  Obesidad.  Problemas para defecar a largo plazo (estreimiento crnico).  Una tos que dura mucho tiempo (crnica).  Acumulacin de lquido en el abdomen debido a ciertas enfermedades y afecciones. Cules son los signos o los sntomas? Los sntomas de esta afeccin incluyen los siguientes:  Escape de orina (prdida del control de la vejiga) al toser, estornudar, esforzarse y ejercitar (incontinencia urinaria de esfuerzo). Esto puede empeorar inmediatamente despus del parto. Puede mejorar gradualmente con el tiempo.  Sensacin de presin en la pelvis o la vagina. Esta presin puede aumentar al toser o al defecar.  Un bulto que sobresale de la abertura de la vagina.  Dificultad para orinar o defecar.  Dolor en la parte inferior de la espalda.  Dolor, molestias o desinters en tener sexo.  Infecciones reiteradas en la vejiga (infecciones de las vas urinarias).  Dificultad para insertar un tampn. En algunas  personas, esta afeccin no causa sntomas. Cmo se diagnostica? Esta afeccin puede diagnosticarse con un examen de la vagina y un examen rectal. Durante el examen, pueden pedirle que tosa y que haga un esfuerzo mientras est acostada, sentada y de pie. El mdico determinar si se requieren otros estudios, como las pruebas de la funcin de la vejiga. Cmo se trata? El tratamiento de esta afeccin puede depender de los sntomas. El tratamiento puede incluir lo siguiente:  Cambios en el estilo de vida, como modificar su dieta.  Vaciar la vejiga en horarios programados (terapia de entrenamiento de la vejiga). Esto puede ayudar a reducir o evitar la incontinencia urinaria.  Estrgeno. Si el prolapso es leve, el estrgeno puede ayudar al aumentar la fuerza y el tono de los msculos del piso plvico.  Ejercicios de Kegel. Estos ejercicios pueden ayudar en los casos leves de prolapso al estirar y tensar los msculos del piso plvico.  Un dispositivo flexible que ayuda a sostener las paredes vaginales y mantener los rganos de la pelvis en su lugar (pesario). El mdico insertar este dispositivo en la vagina.  Ciruga. Esta es a menudo la nica forma de tratamiento de los casos graves de prolapso. Siga estas indicaciones en su casa:  Evite ingerir bebidas que contengan cafena o alcohol.  Aumente el consumo de alimentos ricos en fibra. Esto puede ayudar a disminuir el estreimiento y a evitar realizar esfuerzos durante las deposiciones.  Pierda peso si se lo recomienda el mdico.  Use una toalla higinica o paales para adultos si tiene incontinencia urinaria.  Evite levantar objetos pesados y realizar esfuerzos mientras se ejercita o trabaja. No contenga la respiracin cuando levante pesas   y realice ejercicios de intensidad leve a moderada. Limite sus actividades como se lo haya indicado el mdico.  Realice los ejercicios de Kegel como se lo haya indicado el mdico. Haga lo siguiente: ?  Contraiga con fuerza los msculos del suelo plvico. Debe sentir que se eleva y comprime el rea rectal y tensin en el rea vaginal. Mantenga el estmago, las nalgas y las piernas [place]. ? Mantenga los msculos tensos durante 10segundos como mximo. ? Relaje los msculos. ? Repita este ejercicio 50veces al da o las veces que le haya indicado su mdico. Contine haciendo este ejercicio durante al menos 4 a 6semanas o el tiempo que le haya indicado su mdico.  Tome los medicamentos de venta libre y los recetados solamente como se lo haya indicado el mdico.  Si tiene puesto un pesario, cudelo como se lo haya indicado su mdico.  Concurra a todas las visitas de seguimiento como se lo haya indicado el mdico. Esto es importante. Comunquese con un mdico si:  Tiene sntomas que interfieren con sus actividades diarias o su vida sexual.  Necesita medicamentos para [employer].  Observa un sangrado proveniente de la vagina no relacionado con su menstruacin.  Tiene fiebre.  Siente dolor o sangra al [employer].  Sangra al defecar.  Pierde orina [employer].  Tiene estreimiento crnico.  Su pesario se [place]ar[place] manager.  Tiene secrecin vaginal con mal olor.  Tiene un dolor bajo e inusual en el abdomen. Resumen  Un prolapso de los rganos de la pelvis es el estiramiento, la dilatacin o la cada de los rganos de la pelvis a una posicin anormal. Esto sucede cuando los msculos y tejidos que rodean y sostienen las estructuras plvicas se debilitan o estiran.  Cuando estn comprometidos rganos diferentes a la vagina, estos a menudo se protruyen hacia adentro de la vagina o hacia afuera de la vagina, segn la gravedad del prolapso.  En la [employer], esta afeccin debe tratarse solo si produce sntomas. El tratamiento puede incluir cambios en el estilo de vida, estrgeno, ejercicios de Kegel, la insercin de un pesario o [place].  Evite levantar objetos  pesados y Comptroller se ejercita o trabaja. No contenga la respiracin cuando levante pesas y realice ejercicios de intensidad leve a moderada. Limite sus actividades como se lo haya indicado el mdico. Esta informacin no tiene Theme park manager el consejo del mdico. Asegrese de hacerle al mdico cualquier pregunta que tenga. Document Revised: 11/24/2017 Document Reviewed: 11/24/2017 Elsevier Patient Education  2020 [employer].   Ejercicios de Kegel Kegel Exercises  Los ejercicios de Kegel pueden ayudar a fortalecer los msculos del suelo plvico. El suelo plvico est formado por un grupo de msculos que sostienen el recto, el intestino delgado y la vejiga. En las mujeres, los msculos del suelo plvico tambin sostienen el tero. Estos msculos ayudan a controlar el flujo de [place] y materia fecal (heces). Los ejercicios de Kegel son indoloros y simples, y no requieren ningn equipo. El mdico puede sugerir ejercicios de Kegel para:  Mejorar el control de la vejiga y los intestinos.  Mejorar la respuesta sexual.  Mejorar los msculos dbiles del suelo plvico despus de una ciruga para extirpar el tero (histerectoma) o de un embarazo (mujeres).  Mejorar los msculos del suelo plvico dbiles despus de la extirpacin o ciruga de la prstata (hombres). Los ejercicios de Kegel consisten en contraer los msculos del suelo plvico, que son los mismos que contrae al Education officer, community  el flujo de la orina o Games developer gases. Estos ejercicios se pueden Regulatory affairs officer est sentado, parado o [place], pero lo mejor es variar la posicin. Ejercicios Cmo hacer los ejercicios de Kegel: 1. Contraiga con fuerza los msculos del suelo plvico. Debe sentir que el rea rectal se eleva y se tensa. Si es [place], tambin debe sentir tensin en el rea vaginal. Mantenga el [place], las nalgas y las piernas [place]. 2. Mantenga los msculos tensos durante 10segundos como  mximo. 3. Respire con normalidad. 4. Relaje los msculos. 5. Repita como se lo haya indicado el mdico. Repita este ejercicio a diario como se lo haya indicado el mdico. Contine haciendo este ejercicio durante al menos 4 a 6semanas o el tiempo que le haya indicado su mdico. Pueden derivarlo a un fisioterapeuta que lo puede ayudar a aprender ms sobre cmo hacer los ejercicios de Kegel. Segn sea su estado, el mdico puede recomendarle lo siguiente:  Variar cunto [employer].  Hacer varias series de ejercicios [employer].  Hacer ejercicios durante varias semanas.  Convertir los ejercicios de Kegel en parte de su rutina de ejercicios habitual. Esta informacin no tiene Marine scientist el consejo del mdico. Asegrese de hacerle al mdico cualquier pregunta que tenga. Document Revised: 06/14/2018 Document Reviewed: 06/14/2018 Elsevier Patient Education  2020 [employer].

## 2020-01-10 LAB — URINALYSIS, COMPLETE
Bilirubin, UA: NEGATIVE
Glucose, UA: NEGATIVE
Ketones, UA: NEGATIVE
Leukocytes,UA: NEGATIVE
Nitrite, UA: NEGATIVE
Protein,UA: NEGATIVE
RBC, UA: NEGATIVE
Specific Gravity, UA: 1.01 (ref 1.005–1.030)
Urobilinogen, Ur: 0.2 mg/dL (ref 0.2–1.0)
pH, UA: 7 (ref 5.0–7.5)

## 2020-01-10 LAB — MICROSCOPIC EXAMINATION
Bacteria, UA: NONE SEEN
Epithelial Cells (non renal): NONE SEEN /hpf (ref 0–10)
RBC, Urine: NONE SEEN /hpf (ref 0–2)

## 2020-01-11 ENCOUNTER — Encounter (INDEPENDENT_AMBULATORY_CARE_PROVIDER_SITE_OTHER): Payer: Self-pay | Admitting: Vascular Surgery

## 2020-01-11 ENCOUNTER — Other Ambulatory Visit: Payer: Self-pay

## 2020-01-11 ENCOUNTER — Ambulatory Visit (INDEPENDENT_AMBULATORY_CARE_PROVIDER_SITE_OTHER): Payer: No Typology Code available for payment source | Admitting: Vascular Surgery

## 2020-01-11 VITALS — BP 118/76 | HR 64 | Resp 16 | Ht 60.0 in | Wt 147.0 lb

## 2020-01-11 DIAGNOSIS — I83812 Varicose veins of left lower extremities with pain: Secondary | ICD-10-CM | POA: Diagnosis not present

## 2020-01-11 NOTE — Progress Notes (Signed)
  Ashley Goodman is a 64 y.o. female who presents with symptomatic venous reflux  Past Medical History:  Diagnosis Date  . Thyroid cyst   . Thyroid disease     Past Surgical History:  Procedure Laterality Date  . BREAST BIOPSY Left 2004   neg  . BREAST EXCISIONAL BIOPSY Right 06/22/2012   neg/re-excision  . BREAST EXCISIONAL BIOPSY Right 01/03/2012   neg phyllodes tumor w/ margin involvment     Current Outpatient Medications:  .  Cholecalciferol (D-3-5) 125 MCG (5000 UT) capsule, Take 5,000 Units by mouth daily., Disp: , Rfl:  .  estradiol (ESTRACE) 0.1 MG/GM vaginal cream, Place 1 Applicatorful vaginally 3 (three) times a week. 1-3 x per week, Disp: 42.5 g, Rfl: 5 .  levothyroxine (SYNTHROID) 25 MCG tablet, Take 1 tablet (25 mcg total) by mouth daily before breakfast. 30 min before food d/c 112 mcg, Disp: 90 tablet, Rfl: 3 .  Omega-3 Fatty Acids (OMEGA 3 500 PO), Take by mouth daily., Disp: , Rfl:   No Known Allergies   Varicose veins of leg with pain, left   PLAN: The patient's left lower extremity was sterilely prepped and draped. The ultrasound machine was used to visualize the saphenous vein throughout its course. A segment in the upper calf was selected for access. The saphenous vein was accessed without difficulty using ultrasound guidance with a micropuncture needle. A 0.018 wire was then placed beyond the saphenofemoral junction and the needle was removed. The 65 cm sheath was then placed over the wire and the wire and dilator were removed. The laser fiber was then placed through the sheath and its tip was placed approximately 4-5 centimeters below the saphenofemoral junction. Tumescent anesthesia was then created with a dilute lidocaine solution. Laser energy was then delivered with constant withdrawal of the sheath and laser fiber. Approximately 1384 joules of energy were delivered over a length of 31 centimeters using a 1470 Hz VenaCure machine at 7 W.  Sterile dressings were placed. The patient tolerated the procedure well without obvious complications.   Follow-up in 1 week with post-laser duplex.

## 2020-01-14 ENCOUNTER — Other Ambulatory Visit: Payer: Self-pay

## 2020-01-14 ENCOUNTER — Ambulatory Visit (INDEPENDENT_AMBULATORY_CARE_PROVIDER_SITE_OTHER): Payer: No Typology Code available for payment source

## 2020-01-14 DIAGNOSIS — I83812 Varicose veins of left lower extremities with pain: Secondary | ICD-10-CM

## 2020-01-25 ENCOUNTER — Encounter: Payer: Self-pay | Admitting: Family Medicine

## 2020-01-28 ENCOUNTER — Encounter: Payer: Self-pay | Admitting: Internal Medicine

## 2020-01-28 DIAGNOSIS — K5900 Constipation, unspecified: Secondary | ICD-10-CM | POA: Insufficient documentation

## 2020-01-28 DIAGNOSIS — M48061 Spinal stenosis, lumbar region without neurogenic claudication: Secondary | ICD-10-CM | POA: Insufficient documentation

## 2020-01-28 DIAGNOSIS — R937 Abnormal findings on diagnostic imaging of other parts of musculoskeletal system: Secondary | ICD-10-CM | POA: Insufficient documentation

## 2020-01-28 DIAGNOSIS — M5416 Radiculopathy, lumbar region: Secondary | ICD-10-CM | POA: Insufficient documentation

## 2020-01-28 DIAGNOSIS — N952 Postmenopausal atrophic vaginitis: Secondary | ICD-10-CM | POA: Insufficient documentation

## 2020-01-30 ENCOUNTER — Encounter: Payer: Self-pay | Admitting: Internal Medicine

## 2020-01-30 NOTE — Progress Notes (Signed)
Release of information faxed to [facility] and then to scan. Received confirmation

## 2020-02-08 ENCOUNTER — Telehealth: Payer: Self-pay | Admitting: Nurse Practitioner

## 2020-02-08 NOTE — Telephone Encounter (Signed)
I left pt a vm with appt on 04/17/2020 at 2pm at [facility] for mammo and bone density.

## 2020-03-04 ENCOUNTER — Telehealth: Payer: Self-pay

## 2020-03-04 NOTE — Telephone Encounter (Signed)
Patient has switched PCP. You are now the PCP for this patient.

## 2020-03-04 NOTE — Telephone Encounter (Signed)
[facility] Day - [facility]ie Nonclinical Telephone Record  [facility] Client [facility] Day - [facility] Client Site [facility] - Day Contact Type Call Who Is Calling Patient / Member / Family / Caregiver Caller Name Enrica Corliss Phone Number [phone] Patient Name Ashley Goodman Patient DOB 1955-10-17 Call Type Message Only Information Provided Reason for Call Request for General Office Information Initial Comment Caller states she is calling because she had an MRI done and she was told they were going to refer her to a specialist. They have not called her and she would like to see what can be done about this. Additional Comment She states she works [employer]. She would like to be called after 3 and she needs an interpreter. Disp. Time Disposition Final User 03/03/2020 5:53:49 PM General Information Provided Yes Valli Glance Call Closed By: Valli Glance Transaction Date/Time: 03/03/2020 5:49:56 PM (ET)

## 2020-03-04 NOTE — Telephone Encounter (Signed)
Should this go to [place]? I  do not know about this order.

## 2020-03-04 NOTE — Telephone Encounter (Signed)
She needs to see me then. I don not know her.

## 2020-03-04 NOTE — Telephone Encounter (Signed)
Please schedule

## 2020-04-17 ENCOUNTER — Ambulatory Visit
Admission: RE | Admit: 2020-04-17 | Discharge: 2020-04-17 | Disposition: A | Discharge status: Home / Self Care | Payer: No Typology Code available for payment source | Source: Ambulatory Visit | Attending: Internal Medicine | Admitting: Internal Medicine

## 2020-04-17 DIAGNOSIS — Z1231 Encounter for screening mammogram for malignant neoplasm of breast: Secondary | ICD-10-CM | POA: Diagnosis present

## 2020-04-17 DIAGNOSIS — E2839 Other primary ovarian failure: Secondary | ICD-10-CM | POA: Insufficient documentation

## 2020-04-25 ENCOUNTER — Telehealth: Payer: Self-pay | Admitting: Nurse Practitioner

## 2020-04-25 NOTE — Telephone Encounter (Signed)
Patient informed and verbalized understanding

## 2020-04-25 NOTE — Telephone Encounter (Signed)
Pt was returning call for results she need a interpreter

## 2020-06-09 ENCOUNTER — Other Ambulatory Visit: Payer: Self-pay

## 2020-06-09 ENCOUNTER — Encounter: Payer: Self-pay | Admitting: Nurse Practitioner

## 2020-06-09 ENCOUNTER — Ambulatory Visit (INDEPENDENT_AMBULATORY_CARE_PROVIDER_SITE_OTHER): Payer: No Typology Code available for payment source | Admitting: Nurse Practitioner

## 2020-06-09 VITALS — BP 124/80 | HR 61 | Temp 98.0°F | Ht 60.0 in | Wt 144.6 lb

## 2020-06-09 DIAGNOSIS — E785 Hyperlipidemia, unspecified: Secondary | ICD-10-CM

## 2020-06-09 DIAGNOSIS — E039 Hypothyroidism, unspecified: Secondary | ICD-10-CM

## 2020-06-09 DIAGNOSIS — I83812 Varicose veins of left lower extremities with pain: Secondary | ICD-10-CM

## 2020-06-09 DIAGNOSIS — R748 Abnormal levels of other serum enzymes: Secondary | ICD-10-CM | POA: Diagnosis not present

## 2020-06-09 DIAGNOSIS — E559 Vitamin D deficiency, unspecified: Secondary | ICD-10-CM | POA: Diagnosis not present
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
## 2020-06-09 NOTE — Progress Notes (Signed)
Established Patient Office Visit  Subjective:  Patient ID: Ashley Goodman, female    DOB: Aug 29, 1956  Age: 64 y.o. MRN: [id number]  CC:  Chief Complaint  Patient presents with  . Establish Care    HPI Ashley Goodman is a 64 year old patient previously of Philis Nettle, NP was seen most recently in March by Dr. Terese Door.  She establishes care with me today to follow-up on chronic health problems.   All communication is through the [employer] [facility].  Patient is concerned about medication and office visit costs because she  has [employer] through [place] and needs to have guidance in getting prior authorization for her visits.  She does not think she is getting her medications paid for and does not know how to use this insurance.   The patient is concerned about her veins.  She was seen by Dr. Lucky Cowboy in April for symptomatic venous reflux. Leg burns and feels tired but the cramps got better.  She reports she does not really understand what he did because she still has varicose veins in the lower leg and he only worked on the upper left leg.  Hypothyroid: Presents on levothyroxine 25 mcg daily.  She needs to have her TSH checked.  She has been feeling well.  Hx of vit D def: She is on Ca plus D- need re checked  Elevated alk phos: Alk phos 123 12/06/2019- needs rechecked. NO RUQ pain/    Past Medical History:  Diagnosis Date  . Thyroid cyst   . Thyroid disease     Past Surgical History:  Procedure Laterality Date  . BREAST BIOPSY Left 2004   neg  . BREAST EXCISIONAL BIOPSY Right 06/22/2012   neg/re-excision  . BREAST EXCISIONAL BIOPSY Right 01/03/2012   neg phyllodes tumor w/ margin involvment    Family History  Problem Relation Age of Onset  . Breast cancer Neg Hx     Social History   Socioeconomic History  . Marital status: Divorced    Spouse name: Not on file  . Number of children: Not on file  . Years of  education: Not on file  . Highest education level: Not on file  Occupational History  . Not on file  Tobacco Use  . Smoking status: Never Smoker  . Smokeless tobacco: Never Used  Substance and Sexual Activity  . Alcohol use: Not on file  . Drug use: Never  . Sexual activity: Not on file  Other Topics Concern  . Not on file  Social History Narrative   Works [employer]    Needs interpreter [place] speaking    Social Determinants of Health   Financial Resource Strain:   . Difficulty of Paying Living Expenses: Not on file  Food Insecurity:   . Worried About Charity fundraiser in the Last Year: Not on file  . Ran Out of Food in the Last Year: Not on file  Transportation Needs:   . Lack of Transportation (Medical): Not on file  . Lack of Transportation (Non-Medical): Not on file  Physical Activity:   . Days of Exercise per Week: Not on file  . Minutes of Exercise per Session: Not on file  Stress:   . Feeling of Stress : Not on file  Social Connections:   . Frequency of Communication with Friends and Family: Not on file  . Frequency of Social Gatherings with Friends and Family: Not on file  . Attends Religious Services: Not on  file  . Active Member of Clubs or Organizations: Not on file  . Attends Archivist Meetings: Not on file  . Marital Status: Not on file  Intimate Partner Violence:   . Fear of Current or Ex-Partner: Not on file  . Emotionally Abused: Not on file  . Physically Abused: Not on file  . Sexually Abused: Not on file    Outpatient Medications Prior to Visit  Medication Sig Dispense Refill  . Calcium Carbonate-Vitamin D 600-200 MG-UNIT TABS Take by mouth.    . Cholecalciferol (D-3-5) 125 MCG (5000 UT) capsule Take 2,000 Units by mouth daily.     Marland Kitchen estradiol (ESTRACE) 0.1 MG/GM vaginal cream Place 1 Applicatorful vaginally 3 (three) times a week. 1-3 x per week 42.5 g 5  . levothyroxine (SYNTHROID) 25 MCG tablet Take 1 tablet (25 mcg total) by mouth  daily before breakfast. 30 min before food d/c 112 mcg 90 tablet 3  . Omega-3 Fatty Acids (OMEGA 3 500 PO) Take by mouth daily.     No facility-administered medications prior to visit.    No Known Allergies  Review of Systems  Constitutional: Negative.   HENT: Negative.   Respiratory: Negative.   Cardiovascular: Negative.   Gastrointestinal: Negative.   Genitourinary: Negative.   Musculoskeletal:       Left lower leg varicose veins still bother her. No pain on the right leg.       Objective:    Physical Exam Constitutional:      Appearance: Normal appearance. She is obese.  Cardiovascular:     Rate and Rhythm: Normal rate and regular rhythm.     Pulses: Normal pulses.     Heart sounds: Normal heart sounds.  Pulmonary:     Effort: Pulmonary effort is normal.     Breath sounds: Normal breath sounds.  Abdominal:     Palpations: Abdomen is soft.     Tenderness: There is no abdominal tenderness.  Musculoskeletal:        General: No swelling or tenderness. Normal range of motion.     Cervical back: Normal range of motion and neck supple.     Comments: Left with mild varicose veins. No leg swelling, no calf pain, tenderness or erythema.    Skin:    General: Skin is warm and dry.  Neurological:     General: No focal deficit present.     Mental Status: She is alert and oriented to person, place, and time.  Psychiatric:        Mood and Affect: Mood normal.        Behavior: Behavior normal.     BP 124/80   Pulse 61   Temp 98 F (36.7 C) (Oral)   Ht 5' (1.524 m)   Wt 144 lb 9.6 oz (65.6 kg)   SpO2 95%   BMI 28.24 kg/m  Wt Readings from Last 3 Encounters:  06/09/20 144 lb 9.6 oz (65.6 kg)  01/11/20 147 lb (66.7 kg)  01/09/20 148 lb (67.1 kg)   Pulse Readings from Last 3 Encounters:  06/09/20 61  01/11/20 64  01/09/20 (!) 59    BP Readings from Last 3 Encounters:  06/09/20 124/80  01/11/20 118/76  01/09/20 109/73    Lab Results  Component Value Date    CHOL 213 (H) 12/06/2019   HDL 39.30 12/06/2019   LDLCALC 130 (H) 10/15/2017   LDLDIRECT 115.0 12/06/2019   TRIG 205.0 (H) 12/06/2019   CHOLHDL 5 12/06/2019  Health Maintenance Due  Topic Date Due  . PNA vac Low Risk Adult (1 of 2 - PCV13) Never done  . INFLUENZA VACCINE  04/27/2020    There are no preventive care reminders to display for this patient.  Lab Results  Component Value Date   TSH 3.01 12/06/2019   Lab Results  Component Value Date   WBC 5.6 12/06/2019   HGB 13.7 12/06/2019   HCT 40.3 12/06/2019   MCV 90.5 12/06/2019   PLT 223.0 12/06/2019   Lab Results  Component Value Date   NA 140 12/06/2019   K 4.0 12/06/2019   CO2 30 12/06/2019   GLUCOSE 91 12/06/2019   BUN 15 12/06/2019   CREATININE 0.70 12/06/2019   BILITOT 0.5 12/06/2019   ALKPHOS 123 (H) 12/06/2019   AST 24 12/06/2019   ALT 19 12/06/2019   PROT 7.1 12/06/2019   ALBUMIN 4.0 12/06/2019   CALCIUM 10.3 12/06/2019   ANIONGAP 5 05/27/2017   GFR 83.83 12/06/2019   Lab Results  Component Value Date   CHOL 213 (H) 12/06/2019   Lab Results  Component Value Date   HDL 39.30 12/06/2019   Lab Results  Component Value Date   LDLCALC 130 (H) 10/15/2017   Lab Results  Component Value Date   TRIG 205.0 (H) 12/06/2019   Lab Results  Component Value Date   CHOLHDL 5 12/06/2019   Lab Results  Component Value Date   HGBA1C 5.5 06/05/2019   CLINICAL DATA:  Right-sided abdominal pain for 2 months  EXAM: ABDOMEN ULTRASOUND COMPLETE  COMPARISON:  None.  FINDINGS: Gallbladder: No gallstones or wall thickening visualized. No sonographic Murphy sign noted by sonographer.  Common bile duct: Diameter: 5.7 mm  Liver: No focal lesion identified. Within normal limits in parenchymal echogenicity. Portal vein is patent on color Doppler imaging with normal direction of blood flow towards the liver.  IVC: No abnormality visualized.  Pancreas: Visualized portion  unremarkable.  Spleen: Size and appearance within normal limits.  Right Kidney: Length: 8.7 cm. Echogenicity within normal limits. No mass or hydronephrosis visualized.  Left Kidney: Length: 10.0 cm. Echogenicity within normal limits. No mass or hydronephrosis visualized.  Abdominal aorta: No aneurysm visualized.  Other findings: None.  IMPRESSION: No acute abnormality noted.   Electronically Signed   By: Inez Catalina M.D.   On: 12/31/2019 08:30   Assessment & Plan:   Problem List Items Addressed This Visit      Endocrine   Hypothyroidism - Primary   Relevant Orders   TSH     Other   Vitamin D deficiency   Relevant Orders   VITAMIN D 25 Hydroxy (Vit-D Deficiency, Fractures)   B12 and Folate Panel   Hyperlipidemia   Relevant Orders   CBC with Differential/Platelet   Hemoglobin A1c   Lipid panel   Elevated alkaline phosphatase in newborn   Relevant Orders   Comprehensive metabolic panel   Gamma GT      No orders of the defined types were placed in this encounter. Please get an appt with Dr. Lucky Cowboy with an interpretor with you so you can understand what he did to your left leg in April.   Referral to  community service for help with understanding and utilizing her insurance [employer] through [place].   Please return in tomorrow for fasting blood work at [facility].  Please make follow-up visit for complete physical exam in the next few weeks. Please bring interpreter with you.   Follow-up:  Return in about 3 weeks (around 06/30/2020).   This visit occurred during the SARS-CoV-2 public health emergency.  Safety protocols were in place, including screening questions prior to the visit, additional usage of staff PPE, and extensive cleaning of exam room while observing appropriate contact time as indicated for disinfecting solutions.   Denice Paradise, NP

## 2020-06-10 ENCOUNTER — Other Ambulatory Visit
Admission: RE | Admit: 2020-06-10 | Discharge: 2020-06-10 | Disposition: A | Discharge status: Home / Self Care | Payer: No Typology Code available for payment source | Source: Ambulatory Visit | Attending: Nurse Practitioner | Admitting: Nurse Practitioner

## 2020-06-10 DIAGNOSIS — R748 Abnormal levels of other serum enzymes: Secondary | ICD-10-CM

## 2020-06-10 DIAGNOSIS — E785 Hyperlipidemia, unspecified: Secondary | ICD-10-CM | POA: Diagnosis present

## 2020-06-10 DIAGNOSIS — E559 Vitamin D deficiency, unspecified: Secondary | ICD-10-CM | POA: Diagnosis present

## 2020-06-10 DIAGNOSIS — E039 Hypothyroidism, unspecified: Secondary | ICD-10-CM | POA: Diagnosis present

## 2020-06-10 LAB — COMPREHENSIVE METABOLIC PANEL
ALT: 23 U/L (ref 0–44)
AST: 27 U/L (ref 15–41)
Albumin: 3.8 g/dL (ref 3.5–5.0)
Alkaline Phosphatase: 101 U/L (ref 38–126)
Anion gap: 8 (ref 5–15)
BUN: 19 mg/dL (ref 8–23)
CO2: 24 mmol/L (ref 22–32)
Calcium: 9.8 mg/dL (ref 8.9–10.3)
Chloride: 108 mmol/L (ref 98–111)
Creatinine, Ser: 0.63 mg/dL (ref 0.44–1.00)
GFR calc Af Amer: >60 mL/min (ref >60)
GFR calc non Af Amer: >60 mL/min (ref >60)
Glucose, Bld: 99 mg/dL (ref 70–99)
Potassium: 3.9 mmol/L (ref 3.5–5.1)
Sodium: 140 mmol/L (ref 135–145)
Total Bilirubin: 1 mg/dL (ref 0.3–1.2)
Total Protein: 6.8 g/dL (ref 6.5–8.1)

## 2020-06-10 LAB — LIPID PANEL
Cholesterol: 209 mg/dL — ABNORMAL HIGH (ref 0–200)
HDL: 38 mg/dL — ABNORMAL LOW (ref >40)
LDL Cholesterol: 134 mg/dL — ABNORMAL HIGH (ref 0–99)
Total CHOL/HDL Ratio: 5.5 RATIO
Triglycerides: 185 mg/dL — ABNORMAL HIGH (ref <150)
VLDL: 37 mg/dL (ref 0–40)

## 2020-06-10 LAB — CBC WITH DIFFERENTIAL/PLATELET
Abs Immature Granulocytes: 0.01 10*3/uL (ref 0.00–0.07)
Basophils Absolute: 0 10*3/uL (ref 0.0–0.1)
Basophils Relative: 0 %
Eosinophils Absolute: 0.1 10*3/uL (ref 0.0–0.5)
Eosinophils Relative: 2 %
HCT: 39 % (ref 36.0–46.0)
Hemoglobin: 13 g/dL (ref 12.0–15.0)
Immature Granulocytes: 0 %
Lymphocytes Relative: 32 %
Lymphs Abs: 1.7 10*3/uL (ref 0.7–4.0)
MCH: 30.2 pg (ref 26.0–34.0)
MCHC: 33.3 g/dL (ref 30.0–36.0)
MCV: 90.7 fL (ref 80.0–100.0)
Monocytes Absolute: 0.3 10*3/uL (ref 0.1–1.0)
Monocytes Relative: 6 %
Neutro Abs: 3.2 10*3/uL (ref 1.7–7.7)
Neutrophils Relative %: 60 %
Platelets: 238 10*3/uL (ref 150–400)
RBC: 4.3 MIL/uL (ref 3.87–5.11)
RDW: 13.3 % (ref 11.5–15.5)
WBC: 5.3 10*3/uL (ref 4.0–10.5)
nRBC: 0 % (ref 0.0–0.2)

## 2020-06-10 LAB — FOLATE: Folate: 22 ng/mL (ref >5.9)

## 2020-06-10 LAB — GAMMA GT: GGT: 12 U/L (ref 7–50)

## 2020-06-10 LAB — TSH: TSH: 5.1 u[IU]/mL — ABNORMAL HIGH (ref 0.350–4.500)

## 2020-06-10 LAB — VITAMIN D 25 HYDROXY (VIT D DEFICIENCY, FRACTURES): Vit D, 25-Hydroxy: 60.83 ng/mL (ref 30–100)

## 2020-06-10 LAB — VITAMIN B12: Vitamin B-12: 309 pg/mL (ref 180–914)

## 2020-06-10 LAB — HEMOGLOBIN A1C
Hgb A1c MFr Bld: 5.4 % (ref 4.8–5.6)
Mean Plasma Glucose: 108.28 mg/dL

## 2020-06-13 ENCOUNTER — Encounter: Payer: Self-pay | Admitting: Nurse Practitioner

## 2020-06-17 ENCOUNTER — Telehealth: Payer: Self-pay | Admitting: Nurse Practitioner

## 2020-06-17 NOTE — Addendum Note (Signed)
Addended by: Amedeo Kinsman A on: 06/17/2020 10:05 PM   Modules accepted: Orders

## 2020-06-17 NOTE — Telephone Encounter (Signed)
Referral placed. She saw him in April 2021

## 2020-06-17 NOTE — Telephone Encounter (Signed)
[facility] called stating that pt would like a referral to Dr Ashley Goodman it was discussed at pt last appt in notes. Please advise and Thank you!

## 2020-06-18 ENCOUNTER — Other Ambulatory Visit: Payer: Self-pay

## 2020-06-18 ENCOUNTER — Ambulatory Visit
Admission: EM | Admit: 2020-06-18 | Discharge: 2020-06-18 | Disposition: A | Discharge status: Home / Self Care | Payer: No Typology Code available for payment source | Attending: Family Medicine | Admitting: Family Medicine

## 2020-06-18 DIAGNOSIS — M25521 Pain in right elbow: Secondary | ICD-10-CM

## 2020-06-18 DIAGNOSIS — S161XXA Strain of muscle, fascia and tendon at neck level, initial encounter: Secondary | ICD-10-CM

## 2020-06-18 DIAGNOSIS — M542 Cervicalgia: Secondary | ICD-10-CM

## 2020-06-18 DIAGNOSIS — M25562 Pain in left knee: Secondary | ICD-10-CM

## 2020-06-18 MED ORDER — CYCLOBENZAPRINE HCL 5 MG PO TABS: 5.0000 mg | ORAL_TABLET | Freq: Three times a day (TID) | ORAL | 0 refills | Status: DC | PRN

## 2020-06-18 MED ORDER — PREDNISONE 10 MG PO TABS: 20.0000 mg | ORAL_TABLET | Freq: Every day | ORAL | 0 refills | Status: AC

## 2020-06-18 NOTE — ED Triage Notes (Signed)
Pt is here with left knee, right hand/arm pain that started a month ago, pt has not taken any meds to relieve discomfort.

## 2020-06-18 NOTE — Telephone Encounter (Signed)
Ok received. Thank you!

## 2020-06-18 NOTE — ED Provider Notes (Signed)
Ashley Goodman    CSN: [id number] Arrival date & time: 06/18/20  1723      History   Chief Complaint Chief Complaint  Patient presents with  . Knee Pain  . Hand Pain  . Arm Pain    HPI Ashley Goodman is a 64 y.o. female.   Reports that she has been having right elbow pain, right posterior neck pain, and left knee pain for the last few weeks. 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 Reports that she has taken ibuprofen with no relief. Also reports that ibuprofen upsets her stomach. Reports that she has been wearing a knee sleeve to the left knee when working. Reports that this usually helps but that she is seeing more swelling than usual today. Denies cough, headache, sore throat, nausea, vomiting, diarrhea, rash, fever, other symptoms.  ROS per HPI  The history is provided by the patient.    Past Medical History:  Diagnosis Date  . Thyroid cyst   . Thyroid disease     Patient Active Problem List   Diagnosis Date Noted  . Vitamin D deficiency 06/09/2020  . Hyperlipidemia 06/09/2020  . Elevated alkaline phosphatase in newborn 06/09/2020  . Abnormal MRI, lumbar spine 01/28/2020  . Spinal stenosis of lumbar region 01/28/2020  . Vaginal atrophy 01/28/2020  . Lumbar radiculopathy 01/28/2020  . Constipation 01/28/2020  . Varicose veins of leg with pain, left 11/13/2019  . Pure hypercholesterolemia 10/24/2017  . Obesity (BMI 30.0-34.9) 12/13/2016  . Burning tongue syndrome 11/06/2016  . Epigastric burning sensation 08/24/2016  . Borderline diabetes mellitus 04/17/2015  . 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 History of biliary T-tube placement 09/09/2012  . Can't get food down 07/24/2012  . Breast cyst 12/07/2011  . Hypothyroidism 12/06/2011  . Combined fat and carbohydrate induced hyperlipemia 12/06/2011    Past Surgical History:  Procedure Laterality Date    . BREAST BIOPSY Left 2004   neg  . BREAST EXCISIONAL BIOPSY Right 06/22/2012   neg/re-excision  . BREAST EXCISIONAL BIOPSY Right 01/03/2012   neg phyllodes tumor w/ margin involvment    OB History   No obstetric history on file.      Home Medications    Prior to Admission medications   Medication Sig Start Date End Date Taking? Authorizing Provider  Calcium Carbonate-Vitamin D 600-200 MG-UNIT TABS Take by mouth.    [provider]  Cholecalciferol (D-3-5) 125 MCG (5000 UT) capsule Take 2,000 Units by mouth daily.     [provider]  cyclobenzaprine (FLEXERIL) 5 MG tablet Take 1 tablet (5 mg total) by mouth 3 (three) times daily as needed for muscle spasms. 06/18/20   Moshe Cipro, NP  estradiol (ESTRACE) 0.1 MG/GM vaginal cream Place 1 Applicatorful vaginally 3 (three) times a week. 1-3 x per week 12/07/19   McLean-Scocuzza, Pasty Spillers, MD  levothyroxine (SYNTHROID) 25 MCG tablet Take 1 tablet (25 mcg total) by mouth daily before breakfast. 30 min before food d/c 112 mcg 12/06/19   McLean-Scocuzza, Pasty Spillers, MD  Omega-3 Fatty Acids (OMEGA 3 500 PO) Take by mouth daily.    [provider]  predniSONE (DELTASONE) 10 MG tablet Take 2 tablets (20 mg total) by mouth daily for 5 days. 06/18/20 06/23/20  Moshe Cipro, NP    Family History Family History  Problem Relation Age of Onset  . Hyperlipidemia Mother   . Alzheimer's disease Mother   .  Hypertension Mother   . Arthritis Mother   . Heart Problems Father   . Breast cancer Neg Hx     Social History Social History   Tobacco Use  . Smoking status: Never Smoker  . Smokeless tobacco: Never Used  Substance Use Topics  . Alcohol use: Not on file  . Drug use: Never     Allergies   Patient has no known allergies.   Review of Systems Review of Systems   Physical Exam Triage Vital Signs ED Triage Vitals  Enc Vitals Group     BP      Pulse      Resp      Temp      Temp src       SpO2      Weight      Height      Head Circumference      Peak Flow      Pain Score      Pain Loc      Pain Edu?      Excl. in GC?    No data found.  Updated Vital Signs BP 118/77 (BP Location: Right Arm)   Pulse 67   Temp 98.9 F (37.2 C) (Oral)   Resp 18   SpO2 95%   Visual Acuity Right Eye Distance:   Left Eye Distance:   Bilateral Distance:    Right Eye Near:   Left Eye Near:    Bilateral Near:     Physical Exam Vitals and nursing note reviewed.  Constitutional:      General: She is not in acute distress.    Appearance: She is well-developed.  HENT:     Head: Normocephalic and atraumatic.  Eyes:     Extraocular Movements: Extraocular movements intact.     Conjunctiva/sclera: Conjunctivae normal.     Pupils: Pupils are equal, round, and reactive to light.  Cardiovascular:     Rate and Rhythm: Normal rate and regular rhythm.     Heart sounds: Normal heart sounds. No murmur heard.   Pulmonary:     Effort: Pulmonary effort is normal. No respiratory distress.     Breath sounds: Normal breath sounds. No stridor. No wheezing, rhonchi or rales.  Chest:     Chest wall: No tenderness.  Abdominal:     General: Bowel sounds are normal.     Palpations: Abdomen is soft.     Tenderness: There is no abdominal tenderness.  Musculoskeletal:        General: Swelling and tenderness present.     Right elbow: Swelling present.       Arms:     Cervical back: Neck supple. Tenderness (right posterior neck TTP) present.       Legs:     Comments: Areas of swelling in red, right elbow nontender, left knee nontender   Skin:    General: Skin is warm and dry.     Capillary Refill: Capillary refill takes less than 2 seconds.  Neurological:     General: No focal deficit present.     Mental Status: She is alert and oriented to person, place, and time.  Psychiatric:        Mood and Affect: Mood normal.        Behavior: Behavior normal.        Thought Content: Thought content  normal.      UC Treatments / Results  Labs (all labs ordered are listed, but only abnormal results are displayed)  Labs Reviewed - No data to display  EKG   Radiology No results found.  Procedures Procedures (including critical care time)  Medications Ordered in UC Medications - No data to display  Initial Impression / Assessment and Plan / UC Course  I have reviewed the triage vital signs and the nursing notes.  Pertinent labs & imaging results that were available during my care of the patient were reviewed by me and considered in my medical decision making (see chart for details).    Neck Pain Cervical Strain Right Elbow Pain Left knee pain  Likely strain and overuse injuries Discussed with patient to use a support brace for the right elbow and continue with the knee sleeve as long as it is working for her Prednisone taper prescribed Flexeril prescribed Work note provided Follow up with this ortho or with primary care if symptoms are persisting Final Clinical Impressions(s) / UC Diagnoses   Final diagnoses:  Strain of neck muscle, initial encounter  Neck pain  Right elbow pain  Acute pain of left knee     Discharge Instructions     I have sent in prednisone for you take 2 tablets daily for 5 days  I have also sent in flexeril for you to take as needed for muscle spasms  Get the elbow support to wear at work    ED Prescriptions    Medication Sig Dispense Auth. Provider   cyclobenzaprine (FLEXERIL) 5 MG tablet Take 1 tablet (5 mg total) by mouth 3 (three) times daily as needed for muscle spasms. 30 tablet Moshe Cipro, NP   predniSONE (DELTASONE) 10 MG tablet Take 2 tablets (20 mg total) by mouth daily for 5 days. 10 tablet Moshe Cipro, NP     PDMP not reviewed this encounter.   Moshe Cipro, NP 06/19/20 1359

## 2020-06-18 NOTE — Discharge Instructions (Addendum)
I have sent in prednisone for you take 2 tablets daily for 5 days  I have also sent in flexeril for you to take as needed for muscle spasms  Get the elbow support to wear at work

## 2020-07-01 ENCOUNTER — Encounter: Payer: Self-pay | Admitting: Nurse Practitioner

## 2020-07-01 ENCOUNTER — Ambulatory Visit (INDEPENDENT_AMBULATORY_CARE_PROVIDER_SITE_OTHER): Payer: No Typology Code available for payment source | Admitting: Nurse Practitioner

## 2020-07-01 ENCOUNTER — Other Ambulatory Visit: Payer: Self-pay

## 2020-07-01 VITALS — BP 106/68 | HR 70 | Temp 97.7°F | Resp 16 | Wt 144.0 lb

## 2020-07-01 DIAGNOSIS — E039 Hypothyroidism, unspecified: Secondary | ICD-10-CM

## 2020-07-01 DIAGNOSIS — Z0000 Encounter for general adult medical examination without abnormal findings: Secondary | ICD-10-CM | POA: Insufficient documentation

## 2020-07-01 DIAGNOSIS — M25562 Pain in left knee: Secondary | ICD-10-CM | POA: Diagnosis not present

## 2020-07-01 DIAGNOSIS — G8929 Other chronic pain: Secondary | ICD-10-CM

## 2020-07-01 DIAGNOSIS — R1032 Left lower quadrant pain: Secondary | ICD-10-CM | POA: Diagnosis not present

## 2020-07-01 DIAGNOSIS — E785 Hyperlipidemia, unspecified: Secondary | ICD-10-CM

## 2020-07-01 DIAGNOSIS — Z01419 Encounter for gynecological examination (general) (routine) without abnormal findings: Secondary | ICD-10-CM | POA: Insufficient documentation

## 2020-07-01 DIAGNOSIS — E559 Vitamin D deficiency, unspecified: Secondary | ICD-10-CM | POA: Diagnosis not present

## 2020-07-01 DIAGNOSIS — I517 Cardiomegaly: Secondary | ICD-10-CM

## 2020-07-01 NOTE — Patient Instructions (Addendum)
Llame al Dr. Lucky Cowboy para hacer Ardelia Mems cita de seguimiento sobre las venas varicosas de la pierna izquierda: Direccin: [address], [place], [place] [address] Horas: Valinda Party pronto? 5PM? Naomi a las 8 a. M. Telfono: [phone]  He enviado referencias a ortopedia para el dolor de su rodilla izquierda.  He realizado un pedido para una tomografa computarizada del abdomen y la pelvis para el dolor en la parte inferior izquierda del abdomen.  Debe recibir la vacuna Tdap y la vacuna contra la neumona PCV-13.  Debe recibir [place] y vacuna contra la gripe.  Ven al clinc para anlisis de sangre el 25 de octubre Atencin preventiva 64 aos o ms, mujer La atencin preventiva se refiere a opciones de estilo de vida y visitas a su proveedor de atencin mdica que pueden promover la salud y Musician. Esto incluye: Un examen fsico anual. A esto tambin se le llama una revisin de rutina anual. Exmenes dentales y de la vista regulares. Vacunas. Deteccin de determinadas afecciones. Opciones de estilo de vida saludables, como dieta y ejercicio. Qu puedo esperar de mi visita de atencin preventiva? Examen fsico Su proveedor de Horticulturist, commercial: [place]. Estos pueden usarse para calcular el ndice de masa corporal ([place]), que es una medida que indica si tiene un peso saludable. Frecuencia cardaca y presin arterial. Su piel en busca de manchas anormales. Asesoramiento Su proveedor de atencin mdica puede hacerle preguntas sobre: Consumo de alcohol, tabaco y drogas. El bienestar emocional. Bienestar en el hogar y las [place]. Actividad sexual. Hbitos alimenticios. Historia de cadas. Memoria y capacidad de comprensin (cognicin). Ambiente de [place] y [place]. Embarazo y antecedentes menstruales. Qu vacunas necesito?  Vacuna contra la influenza (gripe) Esto se recomienda todos los [place]. Vacuna contra el ttanos, la difteria y la tos  ferina (Tdap) Es posible que necesite una dosis de refuerzo de Td cada 10 aos. Vacuna contra la varicela (varicela) Es posible que necesite esta vacuna si an no ha sido vacunado. Vacuna contra el zster (culebrilla) Es posible que lo necesite despus de los 6 aos. Vacuna antineumoccica conjugada (PCV13) Se recomienda una dosis despus de los 65 aos. Vacuna antineumoccica de polisacridos (PPSV23) Se recomienda una dosis despus de los 23 aos. Vacuna contra el sarampin, las paperas y la rubola (MMR) Es posible que necesite al menos una dosis de MMR si naci en 1957 o despus. Es posible que tambin necesite una segunda dosis. Vacuna antimeningoccica conjugada (MenACWY) Es posible que lo necesite si tiene ciertas afecciones. Vacuna contra la hepatitis A Es posible que lo necesite si tiene ciertas afecciones o si viaja o trabaja en lugares donde puede estar expuesto a la hepatitis A. Vacuna contra la hepatitis B Es posible que lo necesite si tiene ciertas afecciones o si viaja o trabaja en lugares donde puede estar expuesto a la hepatitis B. Vacuna contra Haemophilus influenzae tipo b (Hib) Es posible que lo necesite si tiene ciertas afecciones. Puede recibir vacunas en dosis individuales o como ms de una vacuna en una sola inyeccin (vacunas combinadas). Hable con su proveedor de atencin [employer] y beneficios de las vacunas combinadas. Qu pruebas necesito? Anlisis de [place] de lpidos y colesterol. Estos pueden revisarse cada 5 aos o con mayor frecuencia, segn su estado de salud general. Prueba de hepatitis C. Prueba de hepatitis B. Poner en pantalla Deteccin de cncer de pulmn. Puede realizarse [employer] todos los aos a partir de los 66 aos si  tiene un historial de tabaquismo de 30 paquetes-ao y actualmente fuma o ha dejado de fumar en los ltimos 15 aos. Examen de deteccin de Surveyor, minerals. Todos los adultos deben realizarse este  examen a partir de los 66 aos y Facilities manager los 84 aos. Su proveedor de Occupational psychologist que se realicen exmenes a los 66 aos si usted tiene Magazine features editor. Le harn pruebas cada 1 a 10 aos, segn sus resultados y el tipo de prueba de Programme researcher, broadcasting/film/video. Examen de deteccin de diabetes. Esto se hace controlando su nivel de azcar en sangre (glucosa) despus de no haber comido durante un tiempo (en ayunas). Es posible que le hagan esto cada 1 a 3 aos. Mamografa. Esto se puede hacer cada 1 a 2 aos. Hable con su proveedor de atencin mdica sobre la frecuencia con la que debe realizarse mamografas peridicas. Examen de deteccin de cncer relacionado con BRCA. Esto se puede hacer si tiene antecedentes familiares de cncer de mama, de ovario, de trompas o peritoneal. Otras pruebas Prueba de enfermedades de transmisin sexual (ETS). Escaneo de densidad sea. Esto se hace para detectar osteoporosis. Es posible que le hagan esto a partir de los 66 aos. Siga estas instrucciones en casa: Comiendo y bebiendo Consuma una dieta que incluya frutas y verduras frescas, cereales integrales, protenas magras y productos lcteos bajos en grasa. Limite la ingesta de alimentos con altas cantidades de azcar, grasas saturadas y sal. Tome los suplementos de vitaminas y minerales que le recomiende su proveedor de Geophysical data processor. No beba alcohol si su proveedor de atencin [employer] que no beba. Si bebe alcohol: Limite la cantidad que tiene de 0 a 1 bebida al [employer]. Tenga en cuenta la cantidad de alcohol que contiene su bebida. En los [place] Una bebida equivale a una botella de cerveza de 12 oz (355 ml), un vaso de vino de 5 oz (148 ml) o un vaso de licor fuerte de 1 oz (44 ml). Estilo de vida Cuida diariamente tus dientes y encas. Mantenerse activo. Haga ejercicio durante al menos 30 minutos 5 o ms das a la semana. No use ningn producto que contenga nicotina o tabaco, como cigarrillos,  cigarrillos electrnicos y tabaco de Higher education careers adviser. Si necesita ayuda para dejar de fumar, consulte con su proveedor de Geophysical data processor. Si es sexualmente activo, practique sexo seguro. Use un condn u otra forma de proteccin para prevenir las ITS (infecciones de transmisin sexual). Hable con su proveedor de atencin [employer] posibilidad de tomar una aspirina o estatina en dosis bajas. Que sigue? Acuda a su proveedor de atencin [employer] vez al ao para una visita de control de rutina. Pregntele a su proveedor de atencin mdica con qu frecuencia debe hacerse revisar los ojos y los dientes. [place]. Esta informacin no pretende representar   Preventive Care 65 Years and Older, Female Preventive care refers to lifestyle choices and visits with your health care provider that can promote health and wellness. This includes:  A yearly physical exam. This is also called an annual well check.  Regular dental and eye exams.  Immunizations.  Screening for certain conditions.  Healthy lifestyle choices, such as diet and exercise. What can I expect for my preventive care visit? Physical exam Your health care provider will check:  Height and weight. These may be used to calculate body mass index (BMI), which is a measurement that tells if you are at a healthy weight.  Heart  rate and blood pressure.  Your skin for abnormal spots. Counseling Your health care provider may ask you questions about:  Alcohol, tobacco, and drug use.  Emotional well-being.  Home and relationship well-being.  Sexual activity.  Eating habits.  History of falls.  Memory and ability to understand (cognition).  Work and work Statistician.  Pregnancy and menstrual history. What immunizations do I need?  Influenza (flu) vaccine  This is recommended every year. Tetanus, diphtheria, and pertussis (Tdap) vaccine  You may need a Td booster every 10 years. Varicella  (chickenpox) vaccine  You may need this vaccine if you have not already been vaccinated. Zoster (shingles) vaccine  You may need this after age 37. Pneumococcal conjugate (PCV13) vaccine  One dose is recommended after age 58. Pneumococcal polysaccharide (PPSV23) vaccine  One dose is recommended after age 2. Measles, mumps, and rubella (MMR) vaccine  You may need at least one dose of MMR if you were born in 1957 or later. You may also need a second dose. Meningococcal conjugate (MenACWY) vaccine  You may need this if you have certain conditions. Hepatitis A vaccine  You may need this if you have certain conditions or if you travel or work in places where you may be exposed to hepatitis A. Hepatitis B vaccine  You may need this if you have certain conditions or if you travel or work in places where you may be exposed to hepatitis B. Haemophilus influenzae type b (Hib) vaccine  You may need this if you have certain conditions. You may receive vaccines as individual doses or as more than one vaccine together in one shot (combination vaccines). Talk with your health care provider about the risks and benefits of combination vaccines. What tests do I need? Blood tests  Lipid and cholesterol levels. These may be checked every 5 years, or more frequently depending on your overall health.  Hepatitis C test.  Hepatitis B test. Screening  Lung cancer screening. You may have this screening every year starting at age 55 if you have a 30-pack-year history of smoking and currently smoke or have quit within the past 15 years.  Colorectal cancer screening. All adults should have this screening starting at age 8 and continuing until age 29. Your health care provider may recommend screening at age 71 if you are at increased risk. You will have tests every 1-10 years, depending on your results and the type of screening test.  Diabetes screening. This is done by checking your blood sugar  (glucose) after you have not eaten for a while (fasting). You may have this done every 1-3 years.  Mammogram. This may be done every 1-2 years. Talk with your health care provider about how often you should have regular mammograms.  BRCA-related cancer screening. This may be done if you have a family history of breast, ovarian, tubal, or peritoneal cancers. Other tests  Sexually transmitted disease (STD) testing.  Bone density scan. This is done to screen for osteoporosis. You may have this done starting at age 35. Follow these instructions at home: Eating and drinking  Eat a diet that includes fresh fruits and vegetables, whole grains, lean protein, and low-fat dairy products. Limit your intake of foods with high amounts of sugar, saturated fats, and salt.  Take vitamin and mineral supplements as recommended by your health care provider.  Do not drink alcohol if your health care provider tells you not to drink.  If you drink alcohol: ? Limit how much you have to  0-1 drink a day. ? Be aware of how much alcohol is in your drink. In the [place] one drink equals one 12 oz bottle of beer (355 mL), one 5 oz glass of wine (148 mL), or one 1 oz glass of hard liquor (44 mL). Lifestyle  Take daily care of your teeth and gums.  Stay active. Exercise for at least 30 minutes on 5 or more days each week.  Do not use any products that contain nicotine or tobacco, such as cigarettes, e-cigarettes, and chewing tobacco. If you need help quitting, ask your health care provider.  If you are sexually active, practice safe sex. Use a condom or other form of protection in order to prevent STIs (sexually transmitted infections).  Talk with your health care provider about taking a low-dose aspirin or statin. What's next?  Go to your health care provider once a year for a well check visit.  Ask your health care provider how often you should have your eyes and teeth checked.  Stay up to date on all  vaccines. This information is not intended to replace advice given to you by your health care provider. Make sure you discuss any questions you have with your health care provider. Document Revised: 09/07/2018 Document Reviewed: 09/07/2018 Elsevier Patient Education  2020 [employer].

## 2020-07-01 NOTE — Progress Notes (Addendum)
Established Patient Office Visit  Subjective:  Patient ID: Ashley Goodman, female    DOB: 1955-11-26  Age: 64 y.o. MRN: [id number]  CC: No chief complaint on file.   HPI Ashley Goodman is a 64 year old patient who comes in Spanish interpreter.  Patient speaks [place] and understands [place].  The interpreter does assist with the detailed conversation.   Rayaan wants to review her recent lab work:   She has hyperlipidemia: Current ASCVD 10-year risk of cardiovascular disease 5.1% borderline.  She is not on statin.  She has been advised diet and exercise.  Hypothyroidism: TSH of 5.1 on levothyroxine 25 mg daily and will need repeated.   History of low vitamin D: Her vitamin D is well supplemented at 60 and she may discontinue the supplementations.    Her previous mild elevation in alkaline phosphatase has resolved and her GGT is 62 within normal.  Her liver panel is normal.    Since last visit, she was seen in the emergency department 06/18/20 for neck pain and arm pain that has since resolved with prednisone.The patient has problems with her left leg-especially the left medial knee is  still bothering.  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  She is wearing the knee sleeve but it continues to bother her.  In the ED, she was diagnosed with likely strain and overuse injuries, and completed the prednisone taper, not using Flexeril.  Patient would like referral to orthopedics.   Patient presents today for complete physical.  Immunizations: Due Covid Moderna due and flu at work.  Declines pneumonia vaccine.  Declines Tdap.  Had a zoster vaccine in 2017  Diet: Needs to improve diet Exercise: no regular -left physical job  Dexa: 04/17/20 Pap Smear:11/28/2018. Vaginal cream only when feeling dry- every month Mammogram: 04/17/20 Colonoscopy 09/21/2012 for screening purposes revealed mucosal prolapse type polyp.  Repeat colonoscopy  in 09/21/2022. Vision: UTD appt in Nov Dentist: UTD Tobacco use: Never used alcohol: Negative  Past Medical History:  Diagnosis Date  . Thyroid cyst   . Thyroid disease     Past Surgical History:  Procedure Laterality Date  . BREAST BIOPSY Left 2004   neg  . BREAST EXCISIONAL BIOPSY Right 06/22/2012   neg/re-excision  . BREAST EXCISIONAL BIOPSY Right 01/03/2012   neg phyllodes tumor w/ margin involvment    Family History  Problem Relation Age of Onset  . Hyperlipidemia Mother   . Alzheimer's disease Mother   . Hypertension Mother   . Arthritis Mother   . Heart Problems Father   . Breast cancer Neg Hx     Social History   Socioeconomic History  . Marital status: Divorced    Spouse name: Not on file  . Number of children: Not on file  . Years of education: Not on file  . Highest education level: Not on file  Occupational History  . Not on file  Tobacco Use  . Smoking status: Never Smoker  . Smokeless tobacco: Never Used  Substance and Sexual Activity  . Alcohol use: Not on file  . Drug use: Never  . Sexual activity: Not Currently    Birth control/protection: None  Other Topics Concern  . Not on file  Social History Narrative   Works [employer]    Needs interpreter spanish speaking    Social Determinants of Health   Financial Resource Strain:   . Difficulty of Paying Living Expenses: Not on file  Food  Insecurity:   . Worried About Programme researcher, broadcasting/film/videounning Out of Food in the Last Year: Not on file  . Ran Out of Food in the Last Year: Not on file  Transportation Needs:   . Lack of Transportation (Medical): Not on file  . Lack of Transportation (Non-Medical): Not on file  Physical Activity:   . Days of Exercise per Week: Not on file  . Minutes of Exercise per Session: Not on file  Stress:   . Feeling of Stress : Not on file  Social Connections:   . Frequency of Communication with Friends and Family: Not on file  . Frequency of Social Gatherings with Friends and Family: Not  on file  . Attends Religious Services: Not on file  . Active Member of Clubs or Organizations: Not on file  . Attends BankerClub or Organization Meetings: Not on file  . Marital Status: Not on file  Intimate Partner Violence:   . Fear of Current or Ex-Partner: Not on file  . Emotionally Abused: Not on file  . Physically Abused: Not on file  . Sexually Abused: Not on file    Outpatient Medications Prior to Visit  Medication Sig Dispense Refill  . Calcium Carbonate-Vitamin D 600-200 MG-UNIT TABS Take by mouth.    . cyclobenzaprine (FLEXERIL) 5 MG tablet Take 1 tablet (5 mg total) by mouth 3 (three) times daily as needed for muscle spasms. 30 tablet 0  . estradiol (ESTRACE) 0.1 MG/GM vaginal cream Place 1 Applicatorful vaginally 3 (three) times a week. 1-3 x per week 42.5 g 5  . levothyroxine (SYNTHROID) 25 MCG tablet Take 1 tablet (25 mcg total) by mouth daily before breakfast. 30 min before food d/c 112 mcg 90 tablet 3  . Cholecalciferol (D-3-5) 125 MCG (5000 UT) capsule Take 2,000 Units by mouth daily.     . Omega-3 Fatty Acids (OMEGA 3 500 PO) Take by mouth daily.     No facility-administered medications prior to visit.    No Known Allergies  Review of Systems  Constitutional: Negative for chills, fever and unexpected weight change.  HENT: Negative.   Eyes: Negative.   Respiratory: Negative.   Gastrointestinal: Positive for abdominal pain. Negative for blood in stool, constipation, diarrhea, nausea and vomiting.  Endocrine: Negative.   Genitourinary: Negative for difficulty urinating, dysuria, frequency and hematuria.  Musculoskeletal: Positive for arthralgias.  Allergic/Immunologic: Negative.   Neurological: Negative.   Hematological: Negative.   Psychiatric/Behavioral:       No concerns with anxiety depression.  No SI or HI.      Objective:    Physical Exam Vitals reviewed.  Constitutional:      Appearance: She is normal weight.  HENT:     Head: Normocephalic.  Eyes:       Conjunctiva/sclera: Conjunctivae normal.     Pupils: Pupils are equal, round, and reactive to light.  Cardiovascular:     Rate and Rhythm: Normal rate and regular rhythm.     Pulses: Normal pulses.     Heart sounds: Normal heart sounds.  Pulmonary:     Breath sounds: Normal breath sounds.  Abdominal:     General: There is no distension.     Palpations: Abdomen is soft. There is no mass.     Tenderness: There is abdominal tenderness. There is no right CVA tenderness, left CVA tenderness, guarding or rebound.     Hernia: No hernia is present.     Comments: Chronic intermittent LLQ pain-   Musculoskeletal:  Cervical back: Normal range of motion and neck supple.  Skin:    General: Skin is warm and dry.  Neurological:     General: No focal deficit present.     Mental Status: She is alert and oriented to person, place, and time.  Psychiatric:        Mood and Affect: Mood normal.        Behavior: Behavior normal.     BP 106/68   Pulse 70   Temp 97.7 F (36.5 C)   Resp 16   Wt 144 lb (65.3 kg)   SpO2 98%   BMI 28.12 kg/m  Wt Readings from Last 3 Encounters:  07/14/20 143 lb (64.9 kg)  07/01/20 144 lb (65.3 kg)  06/09/20 144 lb 9.6 oz (65.6 kg)   Pulse Readings from Last 3 Encounters:  07/14/20 73  07/01/20 70  06/18/20 67    BP Readings from Last 3 Encounters:  07/14/20 117/73  07/01/20 106/68  06/18/20 118/77    Lab Results  Component Value Date   CHOL 209 (H) 06/10/2020   HDL 38 (L) 06/10/2020   LDLCALC 134 (H) 06/10/2020   LDLDIRECT 115.0 12/06/2019   TRIG 185 (H) 06/10/2020   CHOLHDL 5.5 06/10/2020      Health Maintenance Due  Topic Date Due  . PNA vac Low Risk Adult (1 of 2 - PCV13) Never done  . INFLUENZA VACCINE  04/27/2020    There are no preventive care reminders to display for this patient.  Lab Results  Component Value Date   TSH 5.100 (H) 06/10/2020   Lab Results  Component Value Date   WBC 5.3 06/10/2020   HGB 13.0 06/10/2020    HCT 39.0 06/10/2020   MCV 90.7 06/10/2020   PLT 238 06/10/2020   Lab Results  Component Value Date   NA 140 06/10/2020   K 3.9 06/10/2020   CO2 24 06/10/2020   GLUCOSE 99 06/10/2020   BUN 19 06/10/2020   CREATININE 0.70 07/15/2020   BILITOT 1.0 06/10/2020   ALKPHOS 101 06/10/2020   AST 27 06/10/2020   ALT 23 06/10/2020   PROT 6.8 06/10/2020   ALBUMIN 3.8 06/10/2020   CALCIUM 9.8 06/10/2020   ANIONGAP 8 06/10/2020   GFR 83.83 12/06/2019   Lab Results  Component Value Date   CHOL 209 (H) 06/10/2020   Lab Results  Component Value Date   HDL 38 (L) 06/10/2020   Lab Results  Component Value Date   LDLCALC 134 (H) 06/10/2020   Lab Results  Component Value Date   TRIG 185 (H) 06/10/2020   Lab Results  Component Value Date   CHOLHDL 5.5 06/10/2020   Lab Results  Component Value Date   HGBA1C 5.4 06/10/2020      Assessment & Plan:   Problem List Items Addressed This Visit      Endocrine   Hypothyroidism   Relevant Orders   TSH + free T4     Other   Vitamin D deficiency   Hyperlipidemia   Chronic pain of left knee   Relevant Orders   Ambulatory referral to Orthopedic Surgery   Left lower quadrant abdominal pain   Relevant Orders   CT Abdomen Pelvis W Contrast (Completed)   Well woman exam with routine gynecological exam - Primary      No orders of the defined types were placed in this encounter.  Patient comes in for physical but has several problems to discuss. I have placed an order  for a CT of the abdomen and pelvis for your left lower abdomen pain.  She reports abdominal pain that has been present for several months.  No associated symptoms and seems to be stable.  She did have tenderness on exam.  Will check abdominal and pelvic CT to rule out malignancy, doubt diverticulitis with long duration, doubt colitis without diarrhea, no history of surgery in the abdomen to consider adhesions.  Patient has intact ovaries.  Normal CBC  She has  hyperlipidemia: Current ASCVD 10-year risk of cardiovascular disease 5.1% borderline.  She is not on statin.  She has been advised diet and exercise.  Hypothyroidism: TSH of 5.1 on levothyroxine 25 mg daily and will need repeated.   History of low vitamin D: Her vitamin D is well supplemented at 60 and she may discontinue the Vit D supplementations.    Her previous mild elevation in alkaline phosphatase has resolved and her GGT is 62 within normal.  Her liver panel is normal.    She was provided with Dr. Wyn Quaker information to make an appointment to discuss her varicose veins in her leg.  She is a patient of record and he can see her without a new referral.  Placed referral into orthopedics for your left knee pain.  Recommended Tdap vaccine and pneumonia vaccine PCV-13. COVID BOOSTER - Moderna and flu shot.    Return in about 3 months (around 10/01/2020).   This visit occurred during the SARS-CoV-2 public health emergency.  Safety protocols were in place, including screening questions prior to the visit, additional usage of staff PPE, and extensive cleaning of exam room while observing appropriate contact time as indicated for disinfecting solutions.   Amedeo Kinsman, NP

## 2020-07-02 ENCOUNTER — Telehealth: Payer: Self-pay | Admitting: Nurse Practitioner

## 2020-07-02 NOTE — Telephone Encounter (Signed)
Thank you :)

## 2020-07-02 NOTE — Telephone Encounter (Signed)
Pt called returning your call 

## 2020-07-02 NOTE — Telephone Encounter (Signed)
lft vm for pt to call ofc to sch CT. 

## 2020-07-14 ENCOUNTER — Encounter (INDEPENDENT_AMBULATORY_CARE_PROVIDER_SITE_OTHER): Payer: Self-pay | Admitting: Nurse Practitioner

## 2020-07-14 ENCOUNTER — Ambulatory Visit (INDEPENDENT_AMBULATORY_CARE_PROVIDER_SITE_OTHER): Payer: No Typology Code available for payment source | Admitting: Nurse Practitioner

## 2020-07-14 ENCOUNTER — Other Ambulatory Visit: Payer: Self-pay

## 2020-07-14 VITALS — BP 117/73 | HR 73 | Ht 60.0 in | Wt 143.0 lb

## 2020-07-14 DIAGNOSIS — E785 Hyperlipidemia, unspecified: Secondary | ICD-10-CM

## 2020-07-14 DIAGNOSIS — I83812 Varicose veins of left lower extremities with pain: Secondary | ICD-10-CM

## 2020-07-15 ENCOUNTER — Other Ambulatory Visit: Payer: Self-pay

## 2020-07-15 ENCOUNTER — Ambulatory Visit
Admission: RE | Admit: 2020-07-15 | Discharge: 2020-07-15 | Disposition: A | Discharge status: Home / Self Care | Payer: No Typology Code available for payment source | Source: Ambulatory Visit | Attending: Nurse Practitioner | Admitting: Nurse Practitioner

## 2020-07-15 DIAGNOSIS — R1032 Left lower quadrant pain: Secondary | ICD-10-CM | POA: Insufficient documentation

## 2020-07-15 LAB — POCT I-STAT CREATININE: Creatinine, Ser: 0.7 mg/dL (ref 0.44–1.00)

## 2020-07-15 MED ORDER — IOHEXOL 300 MG/ML  SOLN
100.0000 mL | Freq: Once | INTRAMUSCULAR | Status: AC | PRN
  Administered 2020-07-15: 100 mL via INTRAVENOUS

## 2020-07-16 ENCOUNTER — Encounter: Payer: Self-pay | Admitting: Nurse Practitioner

## 2020-07-20 ENCOUNTER — Encounter (INDEPENDENT_AMBULATORY_CARE_PROVIDER_SITE_OTHER): Payer: Self-pay | Admitting: Nurse Practitioner

## 2020-07-20 NOTE — Progress Notes (Signed)
Subjective:    Patient ID: Ashley Goodman, female    DOB: 03-19-1956, 64 y.o.   MRN: [id number] Chief Complaint  Patient presents with  . Follow-up    pt states legs are not better post laser    The patient returns to the office for followup status post laser ablation of the left great saphenous vein on 01/14/2020. Unfortunately, due ot some confusion the patient's initial follow up post laser follow up The patient notes multiple residual varicosities bilaterally which continued to hurt with dependent positions and remained tender to palpation. The patient's swelling is unchanged from preoperative status. The patient continues to wear graduated compression stockings on a daily basis but these are not eliminating the pain and discomfort. The patient continues to use over-the-counter anti-inflammatory medications to treat the pain and related symptoms but this has not given the patient relief. The patient notes the pain in the lower extremities is causing problems with daily exercise, problems at work and even with household activities such as preparing meals and doing dishes.  The patient is otherwise done well and there have been no complications related to the laser procedure or interval changes in the patient's overall   Venous ultrasound post laser shows successful laser ablation of the left GSV, no DVT identified.   Review of Systems  Cardiovascular:       Large varicosities, left leg  All other systems reviewed and are negative.      Objective:   Physical Exam Vitals reviewed.  HENT:     Head: Normocephalic.  Cardiovascular:     Rate and Rhythm: Normal rate.     Pulses: Normal pulses.  Pulmonary:     Effort: Pulmonary effort is normal.  Musculoskeletal:        General: Tenderness present.  Neurological:     Mental Status: She is alert and oriented to person, place, and time.  Psychiatric:        Mood and Affect: Mood normal.        Behavior: Behavior  normal.        Thought Content: Thought content normal.        Judgment: Judgment normal.     BP 117/73   Pulse 73   Ht 5' (1.524 m)   Wt 143 lb (64.9 kg)   BMI 27.93 kg/m   Past Medical History:  Diagnosis Date  . Thyroid cyst   . Thyroid disease     Social History   Socioeconomic History  . Marital status: Divorced    Spouse name: Not on file  . Number of children: Not on file  . Years of education: Not on file  . Highest education level: Not on file  Occupational History  . Not on file  Tobacco Use  . Smoking status: Never Smoker  . Smokeless tobacco: Never Used  Substance and Sexual Activity  . Alcohol use: Not on file  . Drug use: Never  . Sexual activity: Not Currently    Birth control/protection: None  Other Topics Concern  . Not on file  Social History Narrative   Works [employer]    Needs interpreter spanish speaking    Social Determinants of Health   Financial Resource Strain:   . Difficulty of Paying Living Expenses: Not on file  Food Insecurity:   . Worried About Programme researcher, broadcasting/film/video in the Last Year: Not on file  . Ran Out of Food in the Last Year: Not on file  Transportation Needs:   .  Lack of Transportation (Medical): Not on file  . Lack of Transportation (Non-Medical): Not on file  Physical Activity:   . Days of Exercise per Week: Not on file  . Minutes of Exercise per Session: Not on file  Stress:   . Feeling of Stress : Not on file  Social Connections:   . Frequency of Communication with Friends and Family: Not on file  . Frequency of Social Gatherings with Friends and Family: Not on file  . Attends Religious Services: Not on file  . Active Member of Clubs or Organizations: Not on file  . Attends Banker Meetings: Not on file  . Marital Status: Not on file  Intimate Partner Violence:   . Fear of Current or Ex-Partner: Not on file  . Emotionally Abused: Not on file  . Physically Abused: Not on file  . Sexually Abused: Not on  file    Past Surgical History:  Procedure Laterality Date  . BREAST BIOPSY Left 2004   neg  . BREAST EXCISIONAL BIOPSY Right 06/22/2012   neg/re-excision  . BREAST EXCISIONAL BIOPSY Right 01/03/2012   neg phyllodes tumor w/ margin involvment  . colonoscopy  09/21/2012   due for repeat 09/21/2022    Family History  Problem Relation Age of Onset  . Hyperlipidemia Mother   . Alzheimer's disease Mother   . Hypertension Mother   . Arthritis Mother   . Heart Problems Father   . Breast cancer Neg Hx     No Known Allergies     Assessment & Plan:   1. Varicose veins of leg with pain, left Recommend:  The patient has had successful ablation of the previously incompetent saphenous venous system but still has persistent symptoms of pain and swelling that are having a negative impact on daily life and daily activities.  Patient should undergo injection foam sclerotherapy to treat the residual varicosities.  The risks, benefits and alternative therapies were reviewed in detail with the patient.  All questions were answered.  The patient agrees to proceed with sclerotherapy at their convenience.  The patient will continue wearing the graduated compression stockings and using the over-the-counter pain medications to treat her symptoms.       2. Hyperlipidemia, unspecified hyperlipidemia type Continue statin as ordered and reviewed, no changes at this time      Current Outpatient Medications on File Prior to Visit  Medication Sig Dispense Refill  . Calcium Carbonate-Vitamin D 600-200 MG-UNIT TABS Take by mouth.    . cyclobenzaprine (FLEXERIL) 5 MG tablet Take 1 tablet (5 mg total) by mouth 3 (three) times daily as needed for muscle spasms. 30 tablet 0  . estradiol (ESTRACE) 0.1 MG/GM vaginal cream Place 1 Applicatorful vaginally 3 (three) times a week. 1-3 x per week 42.5 g 5  . levothyroxine (SYNTHROID) 25 MCG tablet Take 1 tablet (25 mcg total) by mouth daily before  breakfast. 30 min before food d/c 112 mcg 90 tablet 3  . predniSONE (DELTASONE) 10 MG tablet Take 10 mg by mouth daily with breakfast.     No current facility-administered medications on file prior to visit.    There are no Patient Instructions on file for this visit. No follow-ups on file.   Georgiana Spinner, NP

## 2020-07-21 ENCOUNTER — Other Ambulatory Visit: Payer: No Typology Code available for payment source

## 2020-07-22 ENCOUNTER — Ambulatory Visit: Payer: No Typology Code available for payment source

## 2020-07-22 ENCOUNTER — Telehealth: Payer: Self-pay

## 2020-07-22 NOTE — Telephone Encounter (Signed)
Pt returned your call regarding CT results. She said she might need an interpreter to understand when you call back.

## 2020-07-23 NOTE — Telephone Encounter (Signed)
Spoke with patient about lab results the best I could. Patient would like for son to call today to tomorrow to discuss lab results with him as he speaks better english. Will wait for his call

## 2020-07-23 NOTE — Telephone Encounter (Signed)
LMTCB

## 2020-07-23 NOTE — Addendum Note (Signed)
Addended by: Amedeo Kinsman A on: 07/23/2020 06:05 PM   Modules accepted: Orders

## 2020-07-23 NOTE — Telephone Encounter (Signed)
Patient called back with interpreter and results were discussed with her and the patient.

## 2020-08-19 NOTE — Progress Notes (Signed)
New Outpatient Visit Date: 08/20/2020  Referring Provider: Theadore Nan, NP [address] [place],  [place] [address]  Chief Complaint: Chest pain and shortness of breath  HPI:  [name] is a 64 y.o. female who is being seen today for the evaluation of mild cardiomegaly incidentally noted on CT of the abdomen and pelvis for evaluation of nausea in mid October at the request of [name].  History is obtained with the assistance of a telephone Spanish interpreter. She has a history of hyperlipidemia and thyroid disease.  She notes that after receiving the COVID-19 vaccine earlier this year, [name] began having intermittent shortness of breath.  It is not exertional and seems to come on randomly.  She also reports intermittent chest pain that she describes as "crushing," which also seems to come on randomly.  The pain usually last 2 to 3 minutes and is located in the left side of her chest.  Does not radiate.  There are no associated symptoms.  The chest pain happens about twice a month.  [name] denies a history of prior cardiac disease and testing.  She is unaware of the cardiomegaly reported on the abdominal CT read from October.  She has not had any palpitations, lightheadedness, edema, or orthopnea.   --------------------------------------------------------------------------------------------------  Cardiovascular History & Procedures: Cardiovascular Problems:  Shortness of breath  Chest pain  Questionable cardiomegaly  Risk Factors:  Age > 65  Cath/PCI:  None  CV Surgery:  None  EP Procedures and Devices:  None  Non-Invasive Evaluation(s):  None  Recent CV Pertinent Labs: Lab Results  Component Value Date   CHOL 209 (H) 06/10/2020   CHOL 159 09/06/2014   HDL 38 (L) 06/10/2020   HDL 39 (L) 09/06/2014   LDLCALC 134 (H) 06/10/2020   LDLCALC 94 09/06/2014   LDLDIRECT 115.0 12/06/2019   TRIG 185 (H) 06/10/2020   TRIG 129  09/06/2014   CHOLHDL 5.5 06/10/2020   K 3.9 06/10/2020   K 4.0 09/06/2014   BUN 19 06/10/2020   BUN 18 09/06/2014   CREATININE 0.70 07/15/2020   CREATININE 0.76 09/06/2014    --------------------------------------------------------------------------------------------------  Past Medical History:  Diagnosis Date  . Hyperlipidemia   . Thyroid cyst   . Thyroid disease     Past Surgical History:  Procedure Laterality Date  . BREAST BIOPSY Left 2004   neg  . BREAST EXCISIONAL BIOPSY Right 06/22/2012   neg/re-excision  . BREAST EXCISIONAL BIOPSY Right 01/03/2012   neg phyllodes tumor w/ margin involvment  . colonoscopy  09/21/2012   due for repeat 09/21/2022    Current Meds  Medication Sig  . cholecalciferol (VITAMIN D3) 25 MCG (1000 UNIT) tablet Take 2,000 Units by mouth daily.  . cyclobenzaprine (FLEXERIL) 5 MG tablet Take 1 tablet (5 mg total) by mouth 3 (three) times daily as needed for muscle spasms.  Marland Kitchen estradiol (ESTRACE) 0.1 MG/GM vaginal cream Place 1 Applicatorful vaginally 3 (three) times a week. 1-3 x per week  . levothyroxine (SYNTHROID) 25 MCG tablet Take 1 tablet (25 mcg total) by mouth daily before breakfast. 30 min before food d/c 112 mcg  . [DISCONTINUED] predniSONE (DELTASONE) 10 MG tablet Take 10 mg by mouth daily with breakfast.    Allergies: Patient has no known allergies.  Social History   Tobacco Use  . Smoking status: Never Smoker  . Smokeless tobacco: Never Used  Substance Use Topics  . Alcohol use: Never  . Drug use: Never  Family History  Problem Relation Age of Onset  . Hyperlipidemia Mother   . Alzheimer's disease Mother   . Hypertension Mother   . Arthritis Mother   . Heart Problems Father   . Breast cancer Neg Hx     Review of Systems: A 12-system review of systems was performed and was negative except as noted in the  HPI.  --------------------------------------------------------------------------------------------------  Physical Exam: BP 106/70 (BP Location: Right Arm, Patient Position: Sitting, Cuff Size: Normal)   Pulse (!) 56   Ht 5\' 5"  (1.651 m)   Wt 142 lb 8 oz (64.6 kg)   SpO2 98%   BMI 23.71 kg/m   General: NAD. HEENT: No conjunctival pallor or scleral icterus. Facemask in place. Neck: Supple without lymphadenopathy, thyromegaly, JVD, or HJR. No carotid bruit. Lungs: Normal work of breathing. Clear to auscultation bilaterally without wheezes or crackles. Heart: Bradycardic but regular without murmurs, rubs, or gallops. Non-displaced PMI. Abd: Bowel sounds present. Soft, NT/ND without hepatosplenomegaly Ext: No lower extremity edema. Radial, PT, and DP pulses are 2+ bilaterally Skin: Warm and dry without rash. Neuro: CNIII-XII intact. Strength and fine-touch sensation intact in upper and lower extremities bilaterally. Psych: Normal mood and affect.  EKG: Sinus bradycardia (heart rate 56 bpm).  Otherwise, no abnormality.  Lab Results  Component Value Date   WBC 5.3 06/10/2020   HGB 13.0 06/10/2020   HCT 39.0 06/10/2020   MCV 90.7 06/10/2020   PLT 238 06/10/2020    Lab Results  Component Value Date   NA 140 06/10/2020   K 3.9 06/10/2020   CL 108 06/10/2020   CO2 24 06/10/2020   BUN 19 06/10/2020   CREATININE 0.70 07/15/2020   GLUCOSE 99 06/10/2020   ALT 23 06/10/2020    Lab Results  Component Value Date   CHOL 209 (H) 06/10/2020   HDL 38 (L) 06/10/2020   LDLCALC 134 (H) 06/10/2020   LDLDIRECT 115.0 12/06/2019   TRIG 185 (H) 06/10/2020   CHOLHDL 5.5 06/10/2020     --------------------------------------------------------------------------------------------------  ASSESSMENT AND PLAN: Chest pain and shortness of breath: Symptoms seem to happen randomly and are not clearly associated with one another.  Ms. 06/12/2020 has minimal cardiac risk factors.  Physical exam and  EKG today are unremarkable.  However, given her symptoms and question of cardiomegaly on recent abdominal CT, I have recommended that we obtain a transthoracic echocardiogram and coronary CTA for further evaluation.  In the meantime, we will have [name] to begin taking aspirin 81 mg daily.  If there is no evidence of significant ASCVD, this can be stopped in the future.  Cardiomegaly: Mild cardiomegaly reported on CT of the abdomen from 21.  I have personally reviewed the images and do not see evidence of significant cardiac enlargement.  We will obtain a transthoracic echocardiogram for further characterization, particularly given intermittent chest pain and shortness of breath.  Hyperlipidemia: This was recently diagnosed.  I think it is reasonable to defer initiation of statin therapy pending cardiac CTA.  If there is no evidence of significant ASCVD, it would be reasonable to continue with lifestyle modifications to help lower LDL and triglycerides.  If ASCVD is present, statin therapy will need to be pursued.  Follow-up: Return to clinic after completion of echocardiogram and cardiac CTA.  Ashley Bang, MD 08/22/2020 12:35 PM

## 2020-08-20 ENCOUNTER — Other Ambulatory Visit: Payer: Self-pay | Admitting: Internal Medicine

## 2020-08-20 ENCOUNTER — Other Ambulatory Visit: Payer: Self-pay

## 2020-08-20 ENCOUNTER — Ambulatory Visit: Payer: No Typology Code available for payment source | Admitting: Internal Medicine

## 2020-08-20 ENCOUNTER — Encounter: Payer: Self-pay | Admitting: Internal Medicine

## 2020-08-20 VITALS — BP 106/70 | HR 56 | Ht 65.0 in | Wt 142.5 lb

## 2020-08-20 DIAGNOSIS — I517 Cardiomegaly: Secondary | ICD-10-CM

## 2020-08-20 DIAGNOSIS — R0602 Shortness of breath: Secondary | ICD-10-CM | POA: Diagnosis not present

## 2020-08-20 DIAGNOSIS — R079 Chest pain, unspecified: Secondary | ICD-10-CM

## 2020-08-20 DIAGNOSIS — E782 Mixed hyperlipidemia: Secondary | ICD-10-CM

## 2020-08-20 MED ORDER — ASPIRIN EC 81 MG PO TBEC: 81.0000 mg | DELAYED_RELEASE_TABLET | Freq: Every day | ORAL | 3 refills | Status: DC

## 2020-08-20 MED ORDER — METOPROLOL TARTRATE 25 MG PO TABS: 25.0000 mg | ORAL_TABLET | Freq: Once | ORAL | 0 refills | Status: DC

## 2020-08-20 NOTE — Patient Instructions (Addendum)
Medication Instructions:   Your physician has recommended you make the following change in your medication:   START taking Aspirin $RemoveBefore'81MG'[id number]$  one tablet by mouth daily.   *If you need a refill on your cardiac medications before your next appointment, please call your pharmacy*   Lab Work:  Your physician recommends that you return for lab work in: after your get the CTA scheduled.   - Please go to the [facility]. You will check in at the front desk to the right as you walk into the atrium. Valet Parking is offered if needed. - No appointment needed. You may go any day between 7 am and 6 pm.   Testing/Procedures:  1.  Your physician has requested that you have an echocardiogram. Echocardiography is a painless test that uses sound waves to create images of your heart. It provides your doctor with information about the size and shape of your heart and how well your heart's chambers and valves are working. This procedure takes approximately one hour. There are no restrictions for this procedure.  2.  Your physician has requested that you have a Coronary CTA. Cardiac computed tomography (CT) is a painless test that uses an x-ray machine to take clear, detailed pictures of your heart.   Your cardiac CT will be scheduled at:  [facility] [address] [place], [place] [address] [phone]  Please arrive 15 mins early for check-in and test prep.    Please follow these instructions carefully (unless otherwise directed):    On the Night Before the Test: . Be sure to Drink plenty of water. . Do not consume any caffeinated/decaffeinated beverages or chocolate 12 hours prior to your test. . Do not take any antihistamines 12 hours prior to your test.    On the Day of the Test: . Drink plenty of water. Do not drink any water within one hour of the test. . Do not eat any food 4 hours prior to the test. . You may take your regular  medications prior to the test.  . Take metoprolol (Lopressor) two hours prior to test. (This was sent to [facility]) . FEMALES- please wear underwire-free bra if available        After the Test: . Drink plenty of water. . After receiving IV contrast, you may experience a mild flushed feeling. This is normal. . On occasion, you may experience a mild rash up to 24 hours after the test. This is not dangerous. If this occurs, you can take Benadryl 25 mg and increase your fluid intake. . If you experience trouble breathing, this can be serious. If it is severe call 911 IMMEDIATELY. If it is mild, please call our office.   Once we have confirmed authorization from your insurance company, we will call you to set up a date and time for your test. Based on how quickly your insurance processes prior authorizations requests, please allow up to 4 weeks to be contacted for scheduling your Cardiac CT appointment. Be advised that routine Cardiac CT appointments could be scheduled as many as 8 weeks after your provider has ordered it.  For non-scheduling related questions, please contact the cardiac imaging nurse navigator should you have any questions/concerns: Marchia Bond, Cardiac Imaging Nurse Navigator Burley Saver, Interim Cardiac Imaging Nurse [place] [facility] Direct Office Dial: [phone]   For scheduling needs, including cancellations and rescheduling, please call [place], [phone] (temporary number).     Follow-Up:  At [facility], you and your health needs are our priority.  As part of our continuing mission to provide you with exceptional heart care, we have created designated Provider Care Teams.  These Care Teams include your primary Cardiologist (physician) and Advanced Practice Providers (APPs -  Physician Assistants and Nurse Practitioners) who all work together to provide you with the care you need, when you need it.  We recommend signing up for  the patient portal called "MyChart".  Sign up information is provided on this After Visit Summary.  MyChart is used to connect with patients for Virtual Visits (Telemedicine).  Patients are able to view lab/test results, encounter notes, upcoming appointments, etc.  Non-urgent messages can be sent to your provider as well.   To learn more about what you can do with MyChart, go to [email or web address].    Your next appointment:   6 week(s)  The format for your next appointment:   In Person  Provider:   You may see Dr. Saunders Revel or one of the following Advanced Practice Providers on your designated Care Team:     Murray Hodgkins, NP  Christell Faith, PA-C  Marrianne Mood, PA-C  Cadence Walnut, [place]  Laurann Montana, NP    Other Instructions

## 2020-08-22 ENCOUNTER — Encounter: Payer: Self-pay | Admitting: Internal Medicine

## 2020-08-22 DIAGNOSIS — I517 Cardiomegaly: Secondary | ICD-10-CM | POA: Insufficient documentation

## 2020-08-22 DIAGNOSIS — R079 Chest pain, unspecified: Secondary | ICD-10-CM | POA: Insufficient documentation

## 2020-08-22 DIAGNOSIS — R0602 Shortness of breath: Secondary | ICD-10-CM | POA: Insufficient documentation

## 2020-09-02 ENCOUNTER — Telehealth (HOSPITAL_COMMUNITY): Payer: Self-pay | Admitting: *Deleted

## 2020-09-02 ENCOUNTER — Other Ambulatory Visit
Admission: RE | Admit: 2020-09-02 | Discharge: 2020-09-02 | Disposition: A | Discharge status: Home / Self Care | Payer: No Typology Code available for payment source | Source: Ambulatory Visit | Attending: Internal Medicine | Admitting: Internal Medicine

## 2020-09-02 DIAGNOSIS — I517 Cardiomegaly: Secondary | ICD-10-CM | POA: Insufficient documentation

## 2020-09-02 LAB — BASIC METABOLIC PANEL
Anion gap: 7 (ref 5–15)
BUN: 14 mg/dL (ref 8–23)
CO2: 26 mmol/L (ref 22–32)
Calcium: 10.2 mg/dL (ref 8.9–10.3)
Chloride: 103 mmol/L (ref 98–111)
Creatinine, Ser: 0.66 mg/dL (ref 0.44–1.00)
GFR, Estimated: >60 mL/min (ref >60)
Glucose, Bld: 96 mg/dL (ref 70–99)
Potassium: 4 mmol/L (ref 3.5–5.1)
Sodium: 136 mmol/L (ref 135–145)

## 2020-09-02 NOTE — Telephone Encounter (Signed)
Attempted to call patient regarding upcoming cardiac CT appointment with aid of a Spanish interpreter Richmond Campbell ID [phone]) Left message on voicemail with name and callback number  [place] RN Navigator Cardiac Imaging [facility] [phone] Office [phone] Cell

## 2020-09-03 ENCOUNTER — Telehealth: Payer: Self-pay | Admitting: *Deleted

## 2020-09-03 ENCOUNTER — Telehealth (HOSPITAL_COMMUNITY): Payer: Self-pay | Admitting: *Deleted

## 2020-09-03 NOTE — Telephone Encounter (Signed)
-----   Message from Yvonne Kendall, MD sent at 09/03/2020 11:25 AM EST ----- Normal BMP.  Okay to proceed with cardiac CTA as planned.

## 2020-09-03 NOTE — Telephone Encounter (Signed)
[place] Interpreter ID# [phone]. No answer. Left message to call back.

## 2020-09-03 NOTE — Telephone Encounter (Signed)
Reaching out to patient to offer assistance regarding upcoming cardiac imaging study with aid of a spanish interpreter Cassandria Santee ID: [id number]); pt verbalizes understanding of appt date/time, parking situation and where to check in, pre-test NPO status and medications ordered, and verified current allergies; name and call back number provided for further questions should they arise  [place] [facility] [phone] office [phone] cell

## 2020-09-04 ENCOUNTER — Other Ambulatory Visit: Payer: Self-pay

## 2020-09-04 ENCOUNTER — Ambulatory Visit
Admission: RE | Admit: 2020-09-04 | Discharge: 2020-09-04 | Disposition: A | Discharge status: Home / Self Care | Payer: No Typology Code available for payment source | Source: Ambulatory Visit | Attending: Internal Medicine | Admitting: Internal Medicine

## 2020-09-04 DIAGNOSIS — I517 Cardiomegaly: Secondary | ICD-10-CM | POA: Diagnosis not present

## 2020-09-04 MED ORDER — IOHEXOL 350 MG/ML SOLN
75.0000 mL | Freq: Once | INTRAVENOUS | Status: AC | PRN
  Administered 2020-09-04: 75 mL via INTRAVENOUS

## 2020-09-04 MED ORDER — NITROGLYCERIN 0.4 MG SL SUBL
0.8000 mg | SUBLINGUAL_TABLET | Freq: Once | SUBLINGUAL | Status: AC
  Administered 2020-09-04: 0.8 mg via SUBLINGUAL

## 2020-09-04 NOTE — Progress Notes (Signed)
Patient tolerated procedure well. Ambulate w/o difficulty. Sitting in chair drinking water provided. Encouraged to drink extra water today and reasoning explained. Verbalized understanding. All questions answered. ABC intact. No further needs. Discharge from procedure area w/o issues. Engineer, structural used.

## 2020-09-05 NOTE — Telephone Encounter (Signed)
Patient had CT yesterday.

## 2020-09-12 ENCOUNTER — Ambulatory Visit: Payer: No Typology Code available for payment source

## 2020-09-12 ENCOUNTER — Other Ambulatory Visit: Payer: Self-pay

## 2020-09-12 DIAGNOSIS — I517 Cardiomegaly: Secondary | ICD-10-CM

## 2020-09-12 DIAGNOSIS — I340 Nonrheumatic mitral (valve) insufficiency: Secondary | ICD-10-CM

## 2020-09-12 LAB — ECHOCARDIOGRAM COMPLETE
AR max vel: 1.89 cm2
AV Area VTI: 1.97 cm2
AV Area mean vel: 1.86 cm2
AV Mean grad: 4 mmHg
AV Peak grad: 7.7 mmHg
Ao pk vel: 1.39 m/s
Area-P 1/2: 3.99 cm2
S' Lateral: 3.3 cm
Single Plane A4C EF: 57 %

## 2020-09-16 ENCOUNTER — Encounter (INDEPENDENT_AMBULATORY_CARE_PROVIDER_SITE_OTHER): Payer: Self-pay | Admitting: Vascular Surgery

## 2020-09-16 ENCOUNTER — Ambulatory Visit (INDEPENDENT_AMBULATORY_CARE_PROVIDER_SITE_OTHER): Payer: No Typology Code available for payment source | Admitting: Vascular Surgery

## 2020-09-16 ENCOUNTER — Telehealth: Payer: Self-pay

## 2020-09-16 VITALS — BP 102/65 | HR 60 | Ht 60.0 in | Wt 144.0 lb

## 2020-09-16 DIAGNOSIS — I83812 Varicose veins of left lower extremities with pain: Secondary | ICD-10-CM | POA: Diagnosis not present

## 2020-09-16 NOTE — Telephone Encounter (Signed)
Patient returning call  Patient would like an interpreter for results

## 2020-09-16 NOTE — Telephone Encounter (Signed)
Spoke with patient using hospital interpreter Maryjane Hurter. Reviewed results of echocardiogram and cardiac ct as well. She verbalized understanding of all results with no further questions at this time.

## 2020-09-16 NOTE — Progress Notes (Signed)
  Ashley Goodman is a 64 y.o.female who presents with painful varicose veins of the left leg  Past Medical History:  Diagnosis Date  . Hyperlipidemia   . Thyroid cyst   . Thyroid disease     Past Surgical History:  Procedure Laterality Date  . BREAST BIOPSY Left 2004   neg  . BREAST EXCISIONAL BIOPSY Right 06/22/2012   neg/re-excision  . BREAST EXCISIONAL BIOPSY Right 01/03/2012   neg phyllodes tumor w/ margin involvment  . colonoscopy  09/21/2012   due for repeat 09/21/2022    Current Outpatient Medications  Medication Sig Dispense Refill  . aspirin EC 81 MG tablet Take 1 tablet (81 mg total) by mouth daily. Swallow whole. 90 tablet 3  . cholecalciferol (VITAMIN D3) 25 MCG (1000 UNIT) tablet Take 2,000 Units by mouth daily.    . cyclobenzaprine (FLEXERIL) 5 MG tablet Take 1 tablet (5 mg total) by mouth 3 (three) times daily as needed for muscle spasms. 30 tablet 0  . estradiol (ESTRACE) 0.1 MG/GM vaginal cream Place 1 Applicatorful vaginally 3 (three) times a week. 1-3 x per week 42.5 g 5  . levothyroxine (SYNTHROID) 25 MCG tablet Take 1 tablet (25 mcg total) by mouth daily before breakfast. 30 min before food d/c 112 mcg 90 tablet 3  . metoprolol tartrate (LOPRESSOR) 25 MG tablet Take 1 tablet (25 mg total) by mouth once for 1 dose. Take 2 hours prior to your CT scan. 1 tablet 0   No current facility-administered medications for this visit.    No Known Allergies  Indication: Patient presents with symptomatic varicose veins of the left lower extremity.  Procedure: Foam sclerotherapy was performed on the left lower extremity. Using ultrasound guidance, 5 mL of foam Sotradecol was used to inject the varicosities of the left lower extremity. Compression wraps were placed. The patient tolerated the procedure well.

## 2020-09-16 NOTE — Telephone Encounter (Signed)
LMTCB for echo results. Pt may need interpreter

## 2020-09-16 NOTE — Telephone Encounter (Signed)
-----   Message from Yvonne Kendall, MD sent at 09/15/2020  9:47 PM EST ----- Please let Ashley Goodman know that her echocardiogram does not show any significant abnormalities.  There is mild leakage of her mitral valve, which is quite common and unlikely to be causing her symptoms.  Her heart is contracting normally.  Her heart is also normal in size and wall thickness; the comment of cardiomegaly on her recent abdominal CT is erroneous.  If she continues to have symptoms, she should follow-up as previously scheduled.  Otherwise, she can return to see [place] on an as-needed basis.

## 2020-09-24 IMAGING — MR MR LUMBAR SPINE W/O CM
5 series · 31 of 48 positions shown · non-contrast
Comparison: None available

CLINICAL DATA: Low back pain with lumbar radiculopathy.

EXAM:
MRI LUMBAR SPINE WITHOUT CONTRAST
TECHNIQUE: Multiplanar, multisequence MR imaging of the lumbar spine was
performed. No intravenous contrast was administered.

[Series 5: T2 · sagittal · 4.0mm · 0.81mm/px · 6 of 17 slices shown (1 of 2)]
[im 1/17]
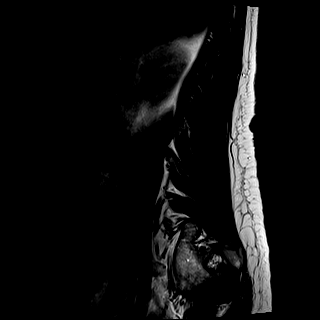
[im 4/17]
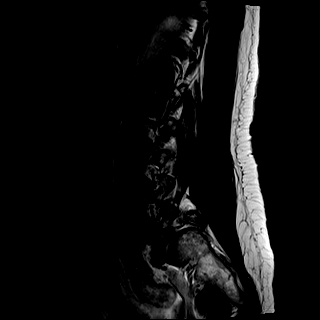
[im 7/17]
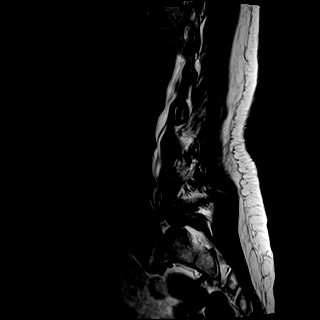
[im 10/17]
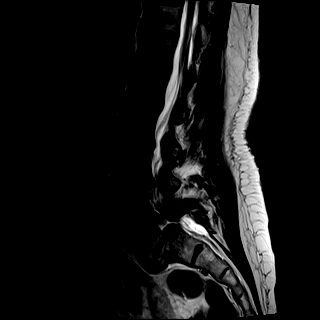
[im 13/17]
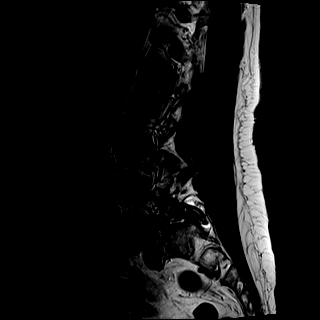
[im 17/17]
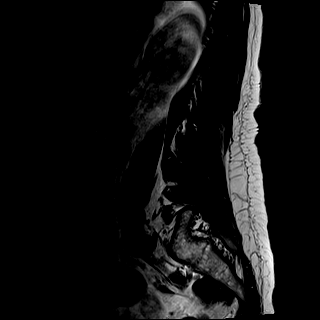

[Series 6: T1 · sagittal · 4.0mm · 0.81mm/px · 7 of 17 slices shown (1 of 2)]
[im 1/17]
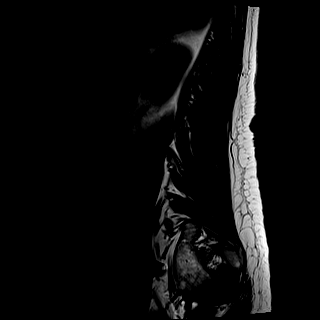
[im 3/17]
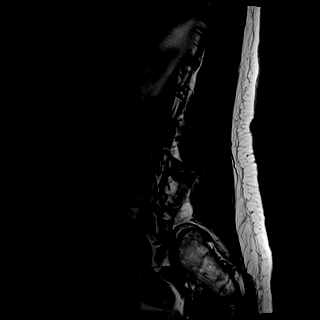
[im 6/17]
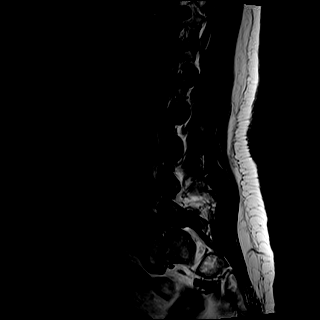
[im 9/17]
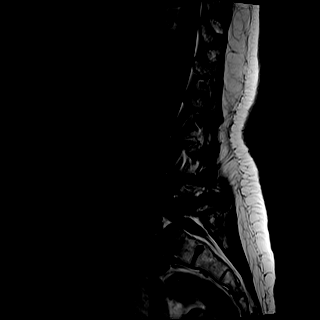
[im 11/17]
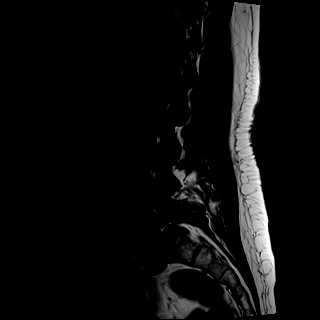
[im 14/17]
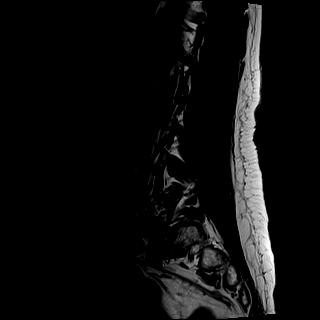
[im 17/17]
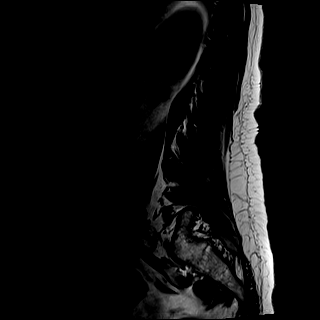

[Series 7: STIR · sagittal · 4.0mm · 0.41mm/px · 2 of 17 slices shown]
[im 1/17]
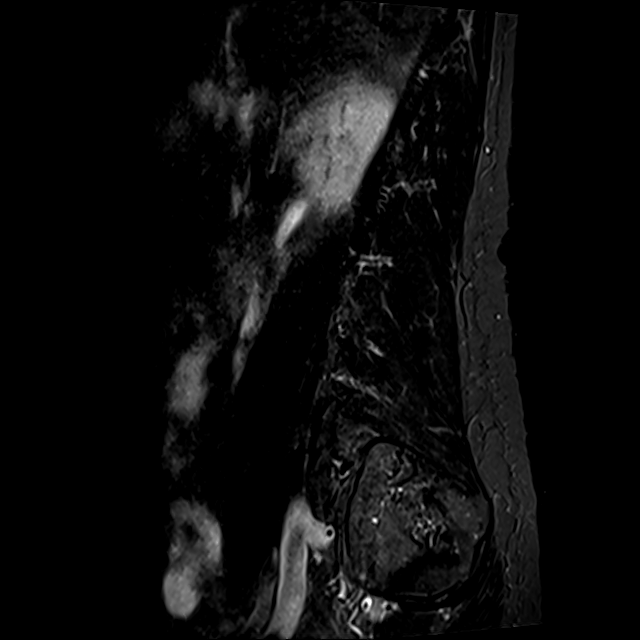
[im 3/17]
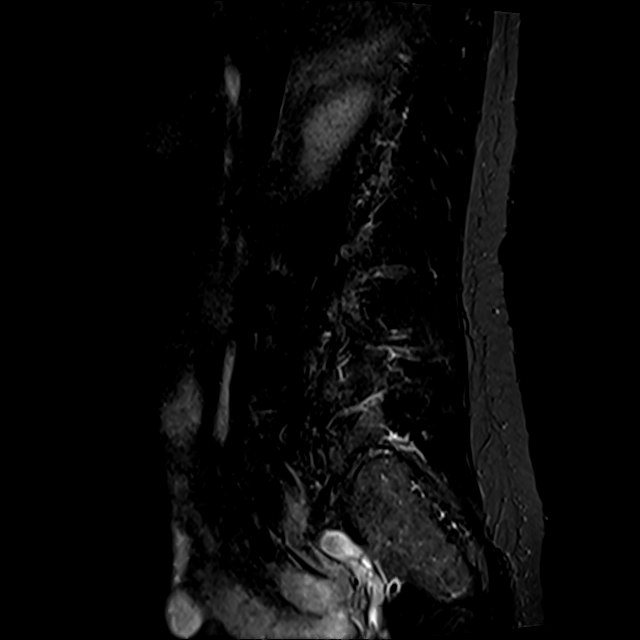

[Series 8: T2 · axial · 4.0mm · 0.78mm/px · z∈[-52,+147]mm · 8 of 36 slices shown (2 of 2)]
[im 1/36]
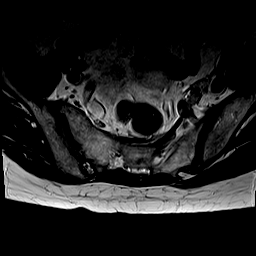
[im 6/36]
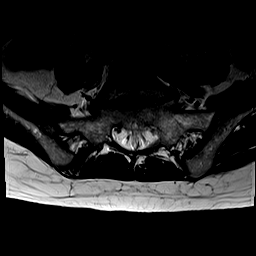
[im 11/36]
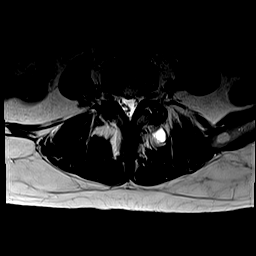
[im 17/36]
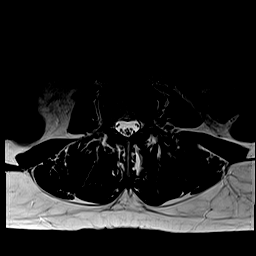
[im 19/36]
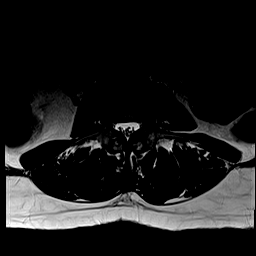
[im 25/36]
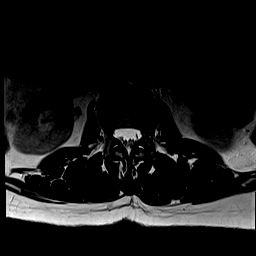
[im 30/36]
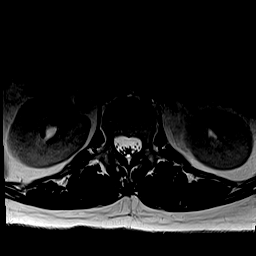
[im 36/36]
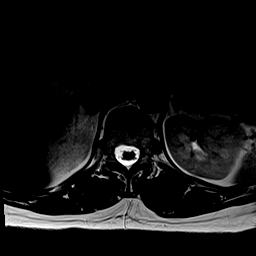

[Series 9: T1 · axial · 4.0mm · 0.39mm/px · z∈[-52,+147]mm · 8 of 36 slices shown (2 of 2)]
[im 1/36]
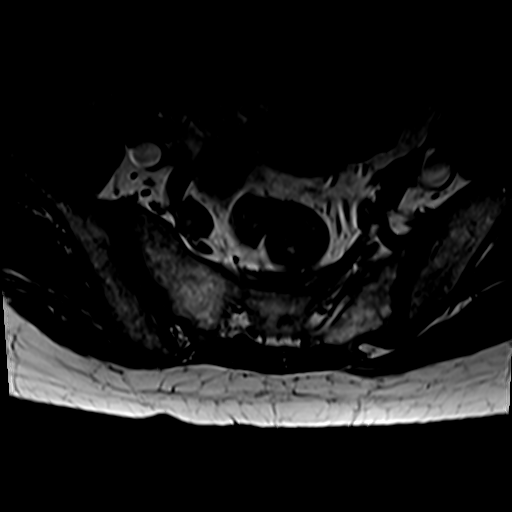
[im 6/36]
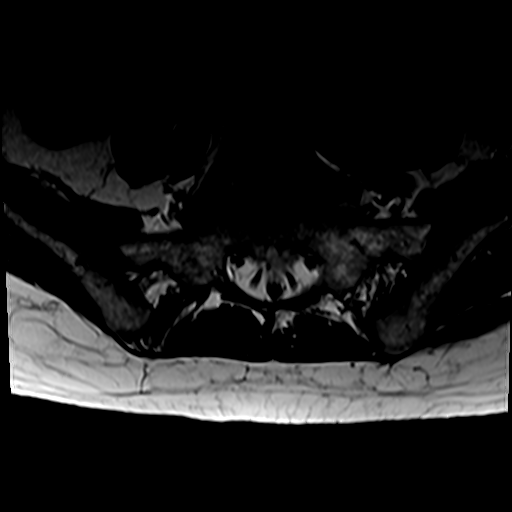
[im 11/36]
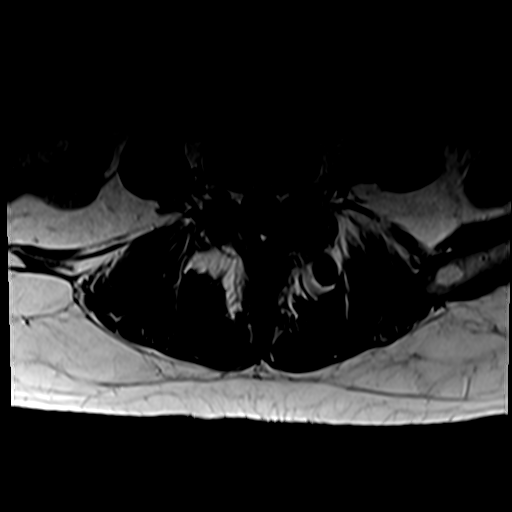
[im 17/36]
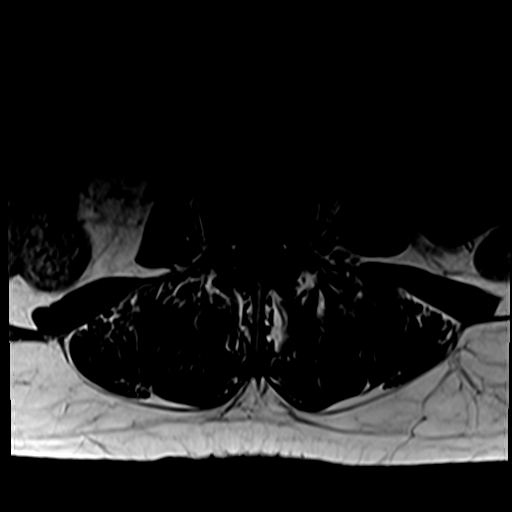
[im 19/36]
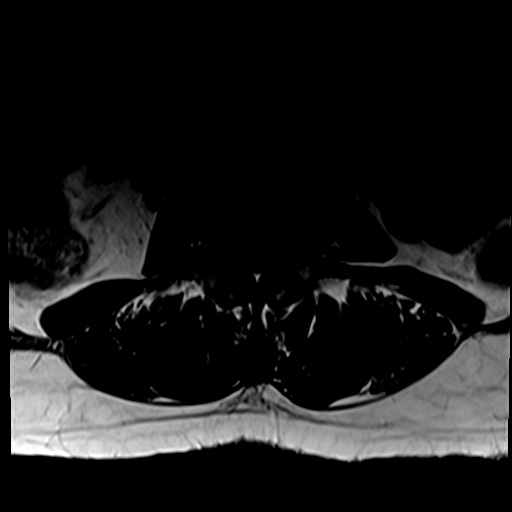
[im 25/36]
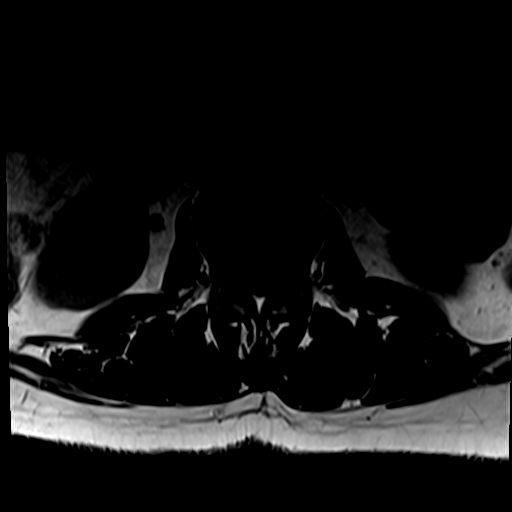
[im 30/36]
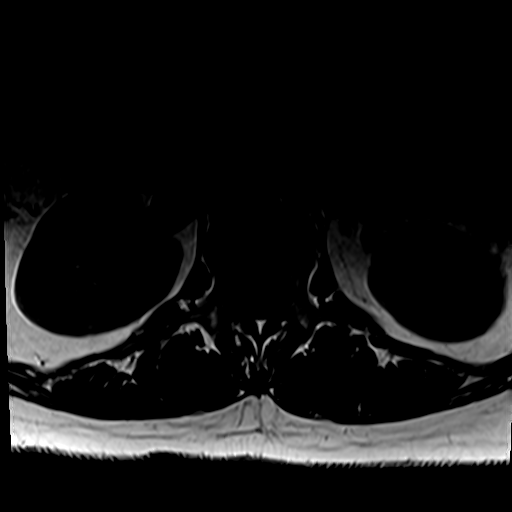
[im 36/36]
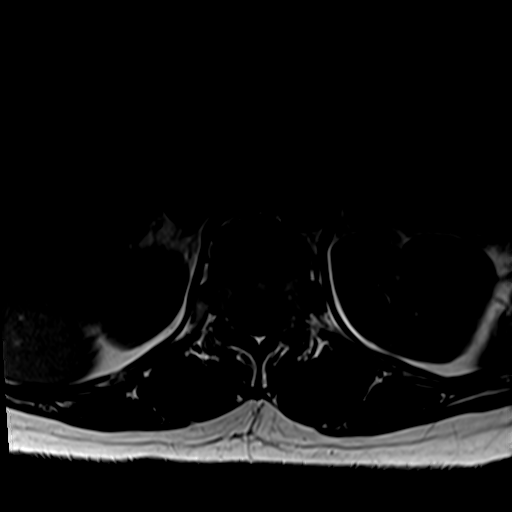

[31 of 48 positions shown; findings below may reference images not displayed]

FINDINGS: Segmentation:  5 lumbar type vertebrae

Alignment: Grade 2 anterolisthesis at L5-S1 and mild retrolisthesis
at L4-5.

Vertebrae: No acute fracture, evidence of discitis, or bone lesion.
Chronic L5 pars defects. Degenerative cysts extent posteriorly from
the pseudo articulations.

Conus medullaris and cauda equina: Conus extends to the L1 level.
Conus and cauda equina appear normal.

Paraspinal and other soft tissues: Negative

Disc levels:

T12- L1: Unremarkable.

L1-L2: Mild disc narrowing and bulging.  No impingement

L2-L3: Mild disc narrowing and bulging. Mild facet spurring. Mild
thecal sac narrowing. Patent foramina

L3-L4: Disc narrowing and bulging effacing the inferior foramina.
Posterior element hypertrophy with mild to moderate spinal stenosis.
The foramina are patent

L4-L5: Disc narrowing and bulging with degenerative facet spurring.
Moderate thecal sac narrowing. Either L5 nerve root could be
affected in the subarticular recesses

L5-S1:Anterolisthesis from chronic pars defects with accelerated
degenerative disc narrowing. Disc bulging. Biforaminal L5
compression. Spinal stenosis with prominent distortion due to the
degree of listhesis.
IMPRESSION: 1. L5 chronic pars defects with L5-S1 grade 2 anterolisthesis and
accelerated disc degeneration. There is biforaminal L5 compression
and spinal stenosis.
2. Spinal stenosis also present at L4-5 (moderate) and L3-4
(mild-to-moderate).

## 2020-09-25 IMAGING — US US ABDOMEN COMPLETE
1 series · 14 of 25 positions shown · non-contrast
Comparison: None.

CLINICAL DATA: Right-sided abdominal pain for 2 months

EXAM:
ABDOMEN ULTRASOUND COMPLETE

[Series 1: us abdomen complete · 14 of 81 slices shown]
[im 1/81]
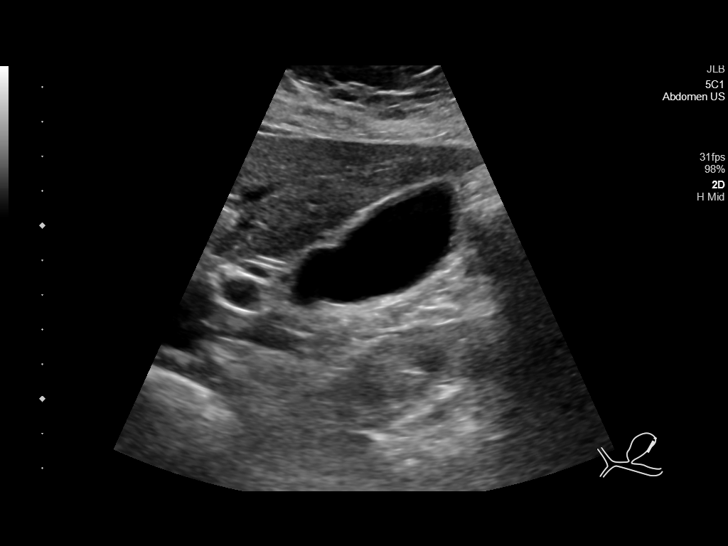
[im 7/81]
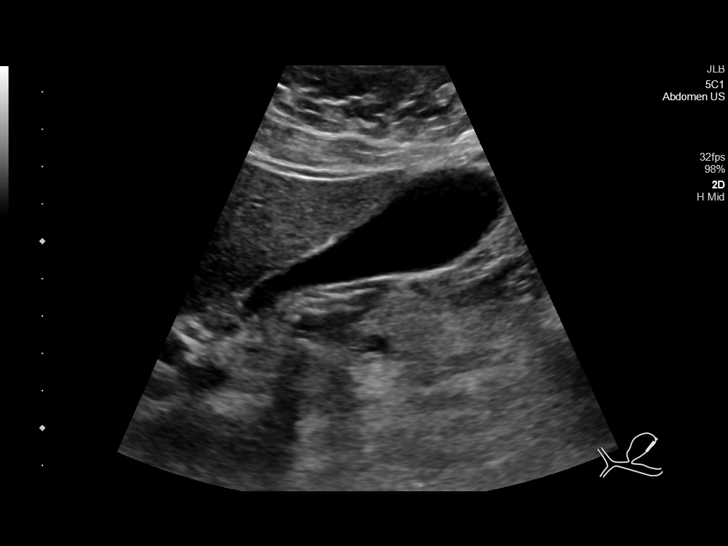
[im 14/81]
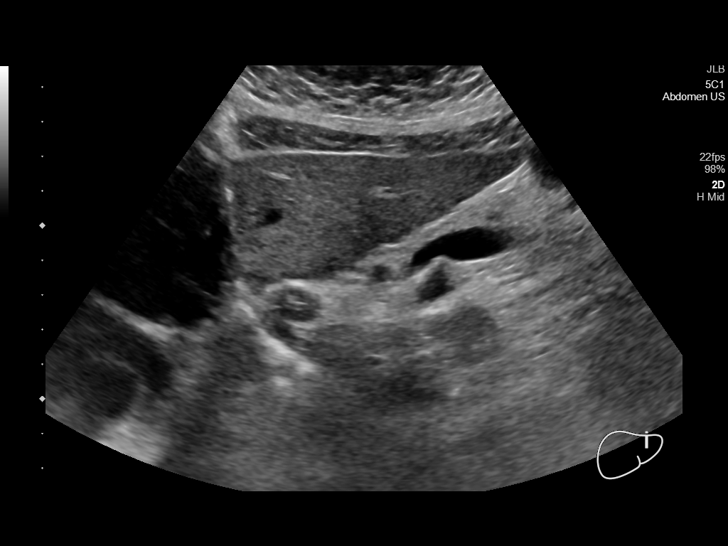
[im 21/81]
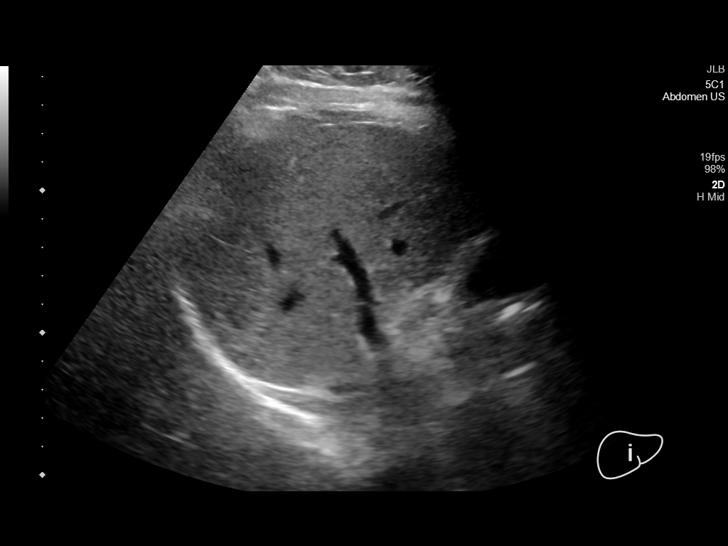
[im 27/81]
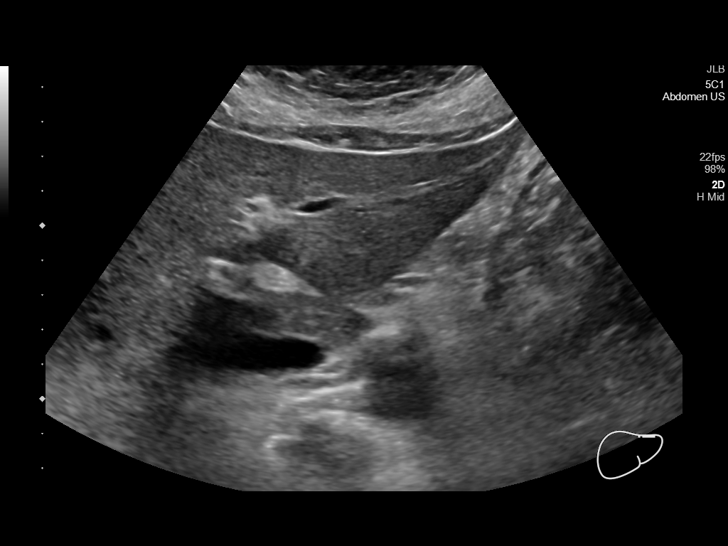
[im 31/81]
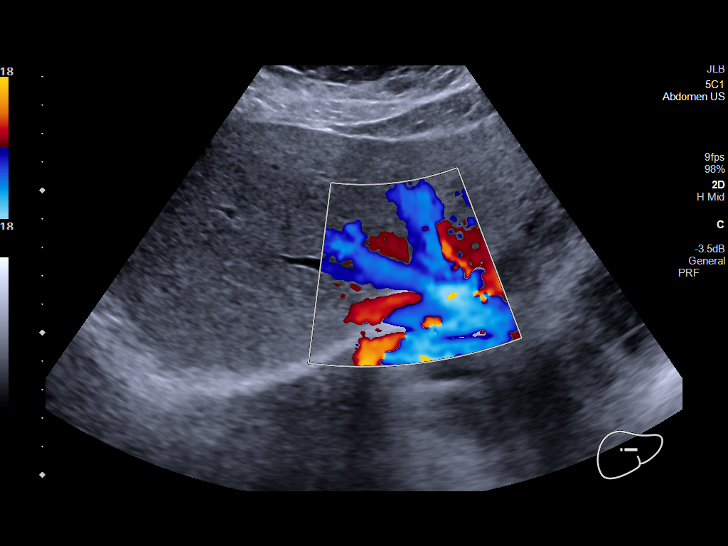
[im 37/81]
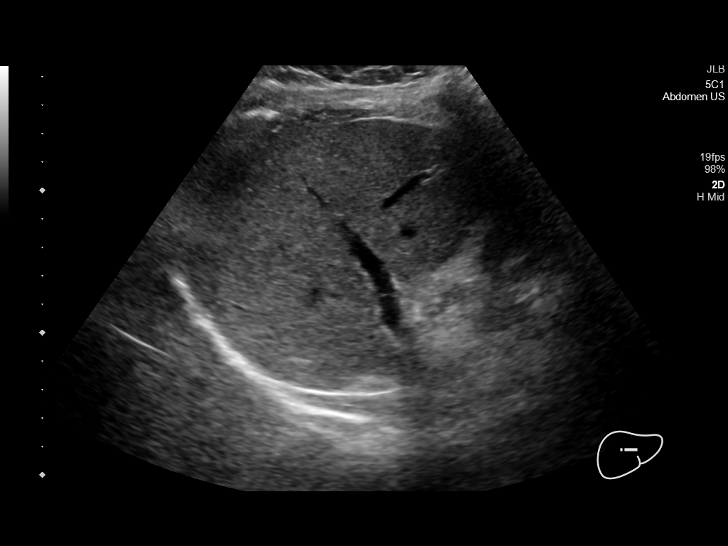
[im 44/81]
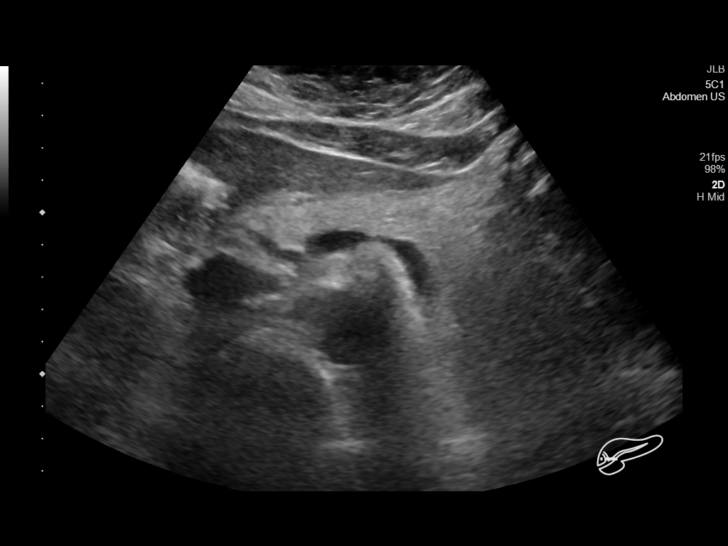
[im 51/81]
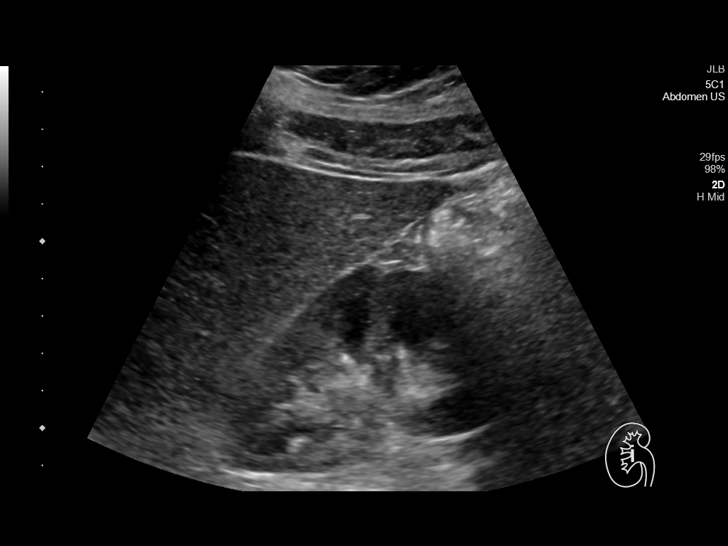
[im 54/81]
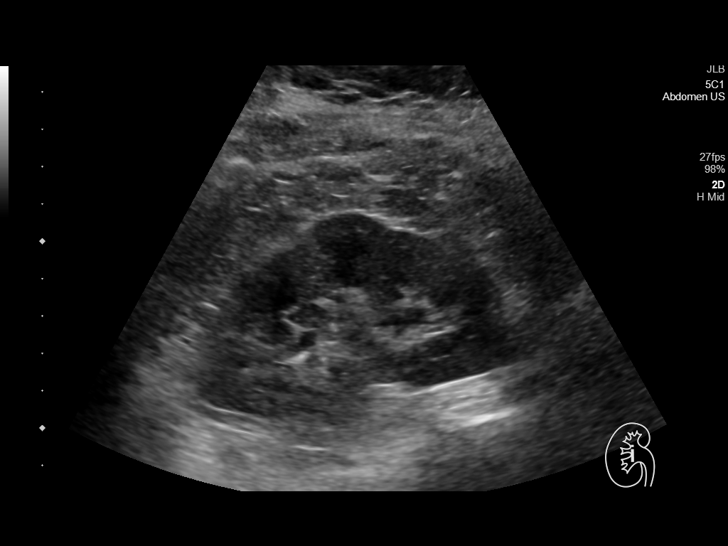
[im 61/81]
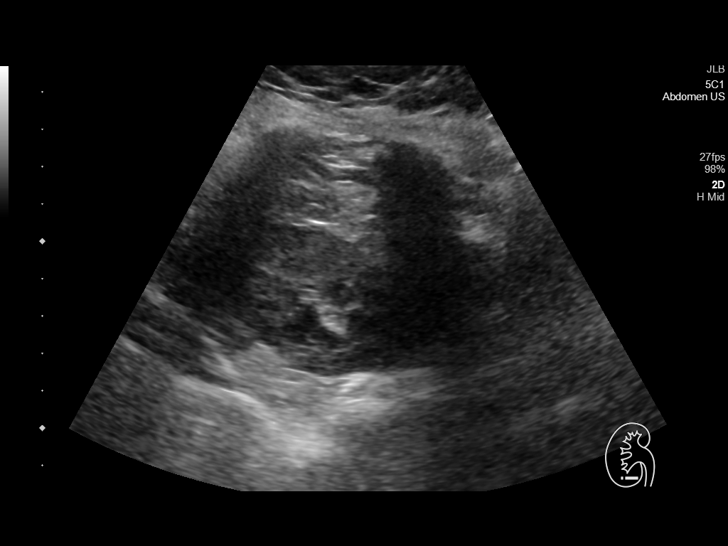
[im 67/81]
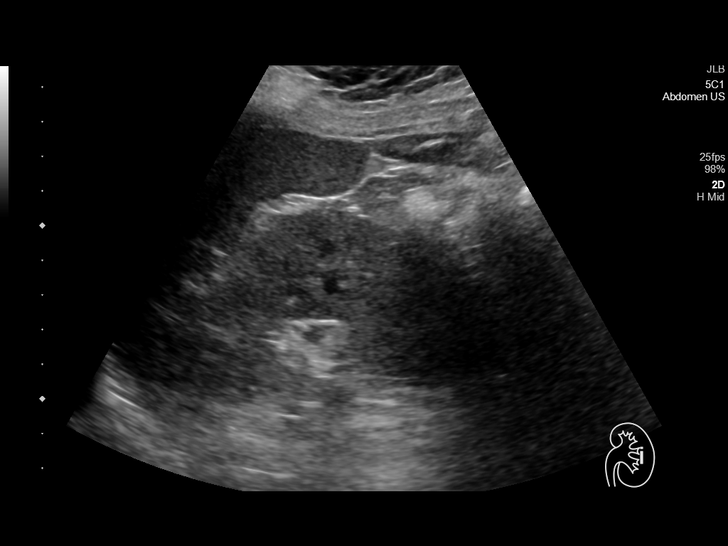
[im 74/81]
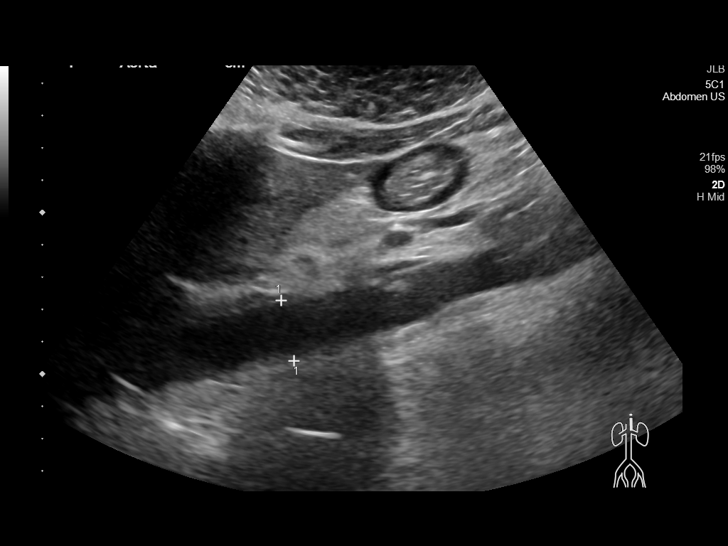
[im 81/81]
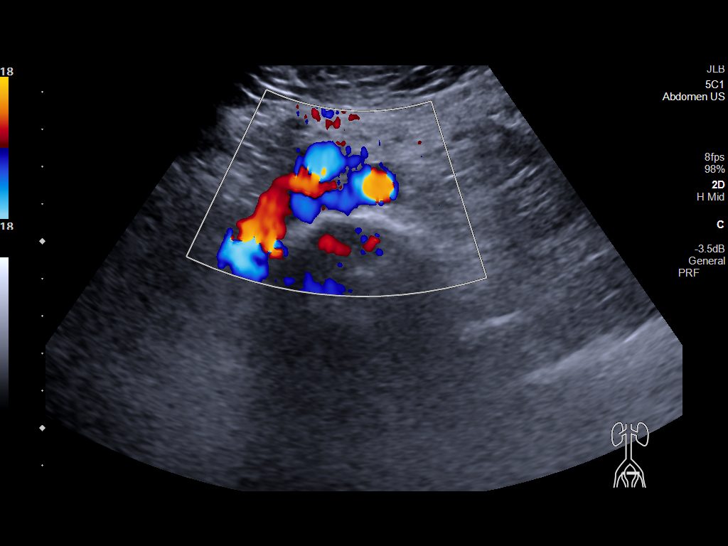

[14 of 25 positions shown; findings below may reference images not displayed]

FINDINGS: Gallbladder: No gallstones or wall thickening visualized. No
sonographic Murphy sign noted by sonographer.

Common bile duct: Diameter: 5.7 mm

Liver: No focal lesion identified. Within normal limits in
parenchymal echogenicity. Portal vein is patent on color Doppler
imaging with normal direction of blood flow towards the liver.

IVC: No abnormality visualized.

Pancreas: Visualized portion unremarkable.

Spleen: Size and appearance within normal limits.

Right Kidney: Length: 8.7 cm. Echogenicity within normal limits. No
mass or hydronephrosis visualized.

Left Kidney: Length: 10.0 cm. Echogenicity within normal limits. No
mass or hydronephrosis visualized.

Abdominal aorta: No aneurysm visualized.

Other findings: None.
IMPRESSION: No acute abnormality noted.

## 2020-10-29 DIAGNOSIS — Z0100 Encounter for examination of eyes and vision without abnormal findings: Secondary | ICD-10-CM | POA: Diagnosis not present

## 2020-10-31 DIAGNOSIS — G479 Sleep disorder, unspecified: Secondary | ICD-10-CM | POA: Diagnosis not present

## 2020-10-31 DIAGNOSIS — R5383 Other fatigue: Secondary | ICD-10-CM | POA: Diagnosis not present

## 2020-10-31 DIAGNOSIS — R208 Other disturbances of skin sensation: Secondary | ICD-10-CM | POA: Diagnosis not present

## 2020-10-31 DIAGNOSIS — M898X9 Other specified disorders of bone, unspecified site: Secondary | ICD-10-CM | POA: Diagnosis not present

## 2020-11-20 ENCOUNTER — Other Ambulatory Visit: Payer: Self-pay | Admitting: Internal Medicine

## 2020-11-20 DIAGNOSIS — M5412 Radiculopathy, cervical region: Secondary | ICD-10-CM | POA: Diagnosis not present

## 2020-11-20 DIAGNOSIS — M542 Cervicalgia: Secondary | ICD-10-CM | POA: Diagnosis not present

## 2020-11-20 DIAGNOSIS — M722 Plantar fascial fibromatosis: Secondary | ICD-10-CM | POA: Diagnosis not present

## 2021-01-08 DIAGNOSIS — M5412 Radiculopathy, cervical region: Secondary | ICD-10-CM | POA: Diagnosis not present

## 2021-01-08 DIAGNOSIS — G8929 Other chronic pain: Secondary | ICD-10-CM | POA: Diagnosis not present

## 2021-01-08 DIAGNOSIS — M25511 Pain in right shoulder: Secondary | ICD-10-CM | POA: Diagnosis not present

## 2021-02-02 ENCOUNTER — Other Ambulatory Visit: Payer: Self-pay

## 2021-02-02 ENCOUNTER — Other Ambulatory Visit: Payer: Self-pay | Admitting: Internal Medicine

## 2021-02-02 DIAGNOSIS — E039 Hypothyroidism, unspecified: Secondary | ICD-10-CM

## 2021-02-02 MED ORDER — LEVOTHYROXINE SODIUM 25 MCG PO TABS
ORAL_TABLET | ORAL | 3 refills | Status: DC
  Filled 2021-02-02: qty 90, 90d supply, fill #0
  Filled 2021-04-20: qty 90, 90d supply, fill #1
  Filled 2021-07-28: qty 90, 90d supply, fill #2

## 2021-02-03 ENCOUNTER — Other Ambulatory Visit: Payer: Self-pay

## 2021-02-03 MED ORDER — KETOROLAC TROMETHAMINE 0.5 % OP SOLN
OPHTHALMIC | 0 refills | Status: DC
  Filled 2021-02-03: qty 3, 5d supply, fill #0

## 2021-02-03 MED ORDER — CARBOXYMETHYLCELLULOSE SOD PF 0.5 % OP SOLN
OPHTHALMIC | 0 refills | Status: DC
  Filled 2021-02-03: qty 30, 5d supply, fill #0

## 2021-02-04 ENCOUNTER — Other Ambulatory Visit: Payer: Self-pay

## 2021-03-24 ENCOUNTER — Other Ambulatory Visit: Payer: Self-pay | Admitting: Internal Medicine

## 2021-03-24 DIAGNOSIS — Z1231 Encounter for screening mammogram for malignant neoplasm of breast: Secondary | ICD-10-CM

## 2021-04-13 DIAGNOSIS — H524 Presbyopia: Secondary | ICD-10-CM | POA: Diagnosis not present

## 2021-04-13 DIAGNOSIS — H43812 Vitreous degeneration, left eye: Secondary | ICD-10-CM | POA: Diagnosis not present

## 2021-04-13 DIAGNOSIS — H5203 Hypermetropia, bilateral: Secondary | ICD-10-CM | POA: Diagnosis not present

## 2021-04-13 DIAGNOSIS — H52223 Regular astigmatism, bilateral: Secondary | ICD-10-CM | POA: Diagnosis not present

## 2021-04-13 DIAGNOSIS — G43B0 Ophthalmoplegic migraine, not intractable: Secondary | ICD-10-CM | POA: Diagnosis not present

## 2021-04-13 DIAGNOSIS — H2513 Age-related nuclear cataract, bilateral: Secondary | ICD-10-CM | POA: Diagnosis not present

## 2021-04-20 ENCOUNTER — Other Ambulatory Visit: Payer: Self-pay

## 2021-04-20 ENCOUNTER — Other Ambulatory Visit: Payer: Self-pay | Admitting: Internal Medicine

## 2021-04-20 ENCOUNTER — Ambulatory Visit
Admission: RE | Admit: 2021-04-20 | Discharge: 2021-04-20 | Disposition: A | Discharge status: Home / Self Care | Payer: Medicare HMO | Source: Ambulatory Visit | Attending: Internal Medicine | Admitting: Internal Medicine

## 2021-04-20 DIAGNOSIS — Z1231 Encounter for screening mammogram for malignant neoplasm of breast: Secondary | ICD-10-CM | POA: Diagnosis not present

## 2021-04-21 ENCOUNTER — Other Ambulatory Visit: Payer: Self-pay

## 2021-04-21 DIAGNOSIS — H6983 Other specified disorders of Eustachian tube, bilateral: Secondary | ICD-10-CM | POA: Diagnosis not present

## 2021-04-21 DIAGNOSIS — J301 Allergic rhinitis due to pollen: Secondary | ICD-10-CM | POA: Diagnosis not present

## 2021-04-21 DIAGNOSIS — R070 Pain in throat: Secondary | ICD-10-CM | POA: Diagnosis not present

## 2021-04-21 MED ORDER — FLUTICASONE PROPIONATE 50 MCG/ACT NA SUSP
2.0000 | Freq: Every day | NASAL | 12 refills | Status: DC
  Filled 2021-04-21: qty 16, 30d supply, fill #0

## 2021-04-22 DIAGNOSIS — M5412 Radiculopathy, cervical region: Secondary | ICD-10-CM | POA: Diagnosis not present

## 2021-04-22 DIAGNOSIS — M25362 Other instability, left knee: Secondary | ICD-10-CM | POA: Diagnosis not present

## 2021-04-22 DIAGNOSIS — Z23 Encounter for immunization: Secondary | ICD-10-CM | POA: Diagnosis not present

## 2021-04-28 ENCOUNTER — Other Ambulatory Visit: Payer: Self-pay | Admitting: Internal Medicine

## 2021-04-28 DIAGNOSIS — M25362 Other instability, left knee: Secondary | ICD-10-CM

## 2021-05-28 DIAGNOSIS — G43B0 Ophthalmoplegic migraine, not intractable: Secondary | ICD-10-CM | POA: Diagnosis not present

## 2021-05-28 DIAGNOSIS — H2513 Age-related nuclear cataract, bilateral: Secondary | ICD-10-CM | POA: Diagnosis not present

## 2021-05-28 DIAGNOSIS — H43812 Vitreous degeneration, left eye: Secondary | ICD-10-CM | POA: Diagnosis not present

## 2021-05-28 DIAGNOSIS — H524 Presbyopia: Secondary | ICD-10-CM | POA: Diagnosis not present

## 2021-05-28 DIAGNOSIS — H5203 Hypermetropia, bilateral: Secondary | ICD-10-CM | POA: Diagnosis not present

## 2021-05-28 DIAGNOSIS — H52223 Regular astigmatism, bilateral: Secondary | ICD-10-CM | POA: Diagnosis not present

## 2021-06-16 ENCOUNTER — Other Ambulatory Visit: Payer: Self-pay

## 2021-06-16 ENCOUNTER — Encounter: Payer: Self-pay | Admitting: Podiatry

## 2021-06-16 ENCOUNTER — Ambulatory Visit: Payer: Medicare HMO | Admitting: Podiatry

## 2021-06-16 ENCOUNTER — Ambulatory Visit (INDEPENDENT_AMBULATORY_CARE_PROVIDER_SITE_OTHER): Payer: Medicare HMO

## 2021-06-16 DIAGNOSIS — M779 Enthesopathy, unspecified: Secondary | ICD-10-CM

## 2021-06-16 DIAGNOSIS — M19079 Primary osteoarthritis, unspecified ankle and foot: Secondary | ICD-10-CM

## 2021-06-16 MED ORDER — MELOXICAM 15 MG PO TABS: 15.0000 mg | ORAL_TABLET | Freq: Every day | ORAL | 2 refills | Status: DC

## 2021-06-16 NOTE — Progress Notes (Signed)
   HPI: 65 y.o. female presenting today as a new patient for evaluation of bilateral midfoot pain this been going on for about 1 year.  Gradual onset.  She denies a history of injury.  She experiences hard pains when walking.  Sometimes the pain at wakes her up at night and it is aggravated more by walking and standing all day.  She says that inserts in her shoes help.  Past Medical History:  Diagnosis Date   Hyperlipidemia    Thyroid cyst    Thyroid disease      Physical Exam: General: The patient is alert and oriented x3 in no acute distress.  Dermatology: Skin is warm, dry and supple bilateral lower extremities. Negative for open lesions or macerations.  Vascular: Palpable pedal pulses bilaterally. No edema or erythema noted. Capillary refill within normal limits.  Neurological: Epicritic and protective threshold grossly intact bilaterally.   Musculoskeletal Exam: Range of motion within normal limits to all pedal and ankle joints bilateral. Muscle strength 5/5 in all groups bilateral.  Pain on palpation noted to the midtarsal joints bilateral  Radiographic Exam:  Normal osseous mineralization. Joint spaces preserved mostly however there does appear to be some degenerative changes noted space on lateral view. No fracture/dislocation/boney destruction.    Assessment: 1.  Capsulitis/DJD bilateral mid foot   Plan of Care:  1. Patient evaluated. X-Rays reviewed.  2.  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  Offered anti-inflammatory injections but she declined. 3.  Prescription for meloxicam 15 mg daily 4.  Recommend good supportive shoes that support the arches of the foot and advised against going barefoot 5.  Return to clinic as needed      Felecia Shelling, DPM [facility]  Dr. Felecia Shelling, DPM    [address] [place], [place] [address]                 Office [phone]  Fax [phone]

## 2021-06-19 ENCOUNTER — Other Ambulatory Visit: Payer: Self-pay

## 2021-06-19 MED ORDER — MELOXICAM 15 MG PO TABS
ORAL_TABLET | ORAL | 2 refills | Status: DC
  Filled 2021-06-19: qty 30, 30d supply, fill #0

## 2021-06-20 ENCOUNTER — Other Ambulatory Visit: Payer: Medicare HMO

## 2021-06-27 ENCOUNTER — Other Ambulatory Visit: Payer: Self-pay

## 2021-06-27 ENCOUNTER — Ambulatory Visit
Admission: RE | Admit: 2021-06-27 | Discharge: 2021-06-27 | Disposition: A | Discharge status: Home / Self Care | Payer: Medicare HMO | Source: Ambulatory Visit | Attending: Internal Medicine | Admitting: Internal Medicine

## 2021-06-27 DIAGNOSIS — M25362 Other instability, left knee: Secondary | ICD-10-CM | POA: Insufficient documentation

## 2021-06-27 DIAGNOSIS — S83232A Complex tear of medial meniscus, current injury, left knee, initial encounter: Secondary | ICD-10-CM | POA: Diagnosis not present

## 2021-06-27 DIAGNOSIS — M25462 Effusion, left knee: Secondary | ICD-10-CM | POA: Diagnosis not present

## 2021-07-14 DIAGNOSIS — S83232A Complex tear of medial meniscus, current injury, left knee, initial encounter: Secondary | ICD-10-CM | POA: Diagnosis not present

## 2021-07-28 ENCOUNTER — Other Ambulatory Visit: Payer: Self-pay

## 2021-08-04 DIAGNOSIS — H6092 Unspecified otitis externa, left ear: Secondary | ICD-10-CM | POA: Diagnosis not present

## 2021-08-04 DIAGNOSIS — H6093 Unspecified otitis externa, bilateral: Secondary | ICD-10-CM | POA: Diagnosis not present

## 2021-08-04 DIAGNOSIS — H6091 Unspecified otitis externa, right ear: Secondary | ICD-10-CM | POA: Diagnosis not present

## 2021-08-17 DIAGNOSIS — H5203 Hypermetropia, bilateral: Secondary | ICD-10-CM | POA: Diagnosis not present

## 2021-08-17 DIAGNOSIS — G43B0 Ophthalmoplegic migraine, not intractable: Secondary | ICD-10-CM | POA: Diagnosis not present

## 2021-08-17 DIAGNOSIS — H43812 Vitreous degeneration, left eye: Secondary | ICD-10-CM | POA: Diagnosis not present

## 2021-08-17 DIAGNOSIS — H10232 Serous conjunctivitis, except viral, left eye: Secondary | ICD-10-CM | POA: Diagnosis not present

## 2021-08-17 DIAGNOSIS — H524 Presbyopia: Secondary | ICD-10-CM | POA: Diagnosis not present

## 2021-08-17 DIAGNOSIS — H2513 Age-related nuclear cataract, bilateral: Secondary | ICD-10-CM | POA: Diagnosis not present

## 2021-08-17 DIAGNOSIS — H52223 Regular astigmatism, bilateral: Secondary | ICD-10-CM | POA: Diagnosis not present

## 2021-11-03 ENCOUNTER — Other Ambulatory Visit: Payer: Self-pay

## 2021-11-03 DIAGNOSIS — H43812 Vitreous degeneration, left eye: Secondary | ICD-10-CM | POA: Diagnosis not present

## 2021-11-03 DIAGNOSIS — G43B0 Ophthalmoplegic migraine, not intractable: Secondary | ICD-10-CM | POA: Diagnosis not present

## 2021-11-03 DIAGNOSIS — H10233 Serous conjunctivitis, except viral, bilateral: Secondary | ICD-10-CM | POA: Diagnosis not present

## 2021-11-03 DIAGNOSIS — H524 Presbyopia: Secondary | ICD-10-CM | POA: Diagnosis not present

## 2021-11-03 DIAGNOSIS — H52223 Regular astigmatism, bilateral: Secondary | ICD-10-CM | POA: Diagnosis not present

## 2021-11-03 DIAGNOSIS — H5203 Hypermetropia, bilateral: Secondary | ICD-10-CM | POA: Diagnosis not present

## 2021-11-03 DIAGNOSIS — H2513 Age-related nuclear cataract, bilateral: Secondary | ICD-10-CM | POA: Diagnosis not present

## 2021-11-04 ENCOUNTER — Other Ambulatory Visit: Payer: Self-pay

## 2021-12-08 ENCOUNTER — Encounter: Payer: Medicare HMO | Admitting: Adult Health

## 2021-12-10 ENCOUNTER — Ambulatory Visit (INDEPENDENT_AMBULATORY_CARE_PROVIDER_SITE_OTHER): Payer: Medicare HMO | Admitting: Nurse Practitioner

## 2021-12-10 ENCOUNTER — Encounter: Payer: Self-pay | Admitting: Nurse Practitioner

## 2021-12-10 ENCOUNTER — Other Ambulatory Visit: Payer: Self-pay

## 2021-12-10 VITALS — BP 110/80 | HR 72 | Temp 99.0°F | Ht 60.0 in | Wt 153.0 lb

## 2021-12-10 DIAGNOSIS — R12 Heartburn: Secondary | ICD-10-CM | POA: Insufficient documentation

## 2021-12-10 DIAGNOSIS — M79672 Pain in left foot: Secondary | ICD-10-CM | POA: Diagnosis not present

## 2021-12-10 DIAGNOSIS — M79671 Pain in right foot: Secondary | ICD-10-CM | POA: Diagnosis not present

## 2021-12-10 DIAGNOSIS — Z7689 Persons encountering health services in other specified circumstances: Secondary | ICD-10-CM | POA: Diagnosis not present

## 2021-12-10 DIAGNOSIS — M8580 Other specified disorders of bone density and structure, unspecified site: Secondary | ICD-10-CM | POA: Diagnosis not present

## 2021-12-10 DIAGNOSIS — F411 Generalized anxiety disorder: Secondary | ICD-10-CM | POA: Diagnosis not present

## 2021-12-10 DIAGNOSIS — R2232 Localized swelling, mass and lump, left upper limb: Secondary | ICD-10-CM

## 2021-12-10 DIAGNOSIS — R635 Abnormal weight gain: Secondary | ICD-10-CM | POA: Diagnosis not present

## 2021-12-10 DIAGNOSIS — E039 Hypothyroidism, unspecified: Secondary | ICD-10-CM | POA: Diagnosis not present

## 2021-12-10 MED ORDER — HYDROXYZINE PAMOATE 25 MG PO CAPS: 25.0000 mg | ORAL_CAPSULE | Freq: Every evening | ORAL | 0 refills | Status: DC | PRN

## 2021-12-10 MED ORDER — OMEPRAZOLE 20 MG PO CPDR: 20.0000 mg | DELAYED_RELEASE_CAPSULE | Freq: Every day | ORAL | 1 refills | Status: DC

## 2021-12-10 MED ORDER — OMEPRAZOLE 20 MG PO CPDR
20.0000 mg | DELAYED_RELEASE_CAPSULE | Freq: Every day | ORAL | 1 refills | Status: DC
  Filled 2021-12-10: qty 30, 30d supply, fill #0

## 2021-12-10 MED ORDER — HYDROXYZINE PAMOATE 25 MG PO CAPS
25.0000 mg | ORAL_CAPSULE | Freq: Every evening | ORAL | 0 refills | Status: DC | PRN
Start: 2021-12-10 — End: 2021-12-10
  Filled 2021-12-10: qty 30, 30d supply, fill #0

## 2021-12-10 NOTE — Assessment & Plan Note (Signed)
Patient has history of osteopenia last DEXA was 04/15/2020.  Patient was told to be on vitamin D and calcium.  She states she has been stopped did note that patient had a high calcium level.  Pending labs today we will have patient repeat DEXA scan in approximately 6 months. ?

## 2021-12-10 NOTE — Progress Notes (Signed)
? ?New Patient Office Visit ? ?Subjective:  ?Patient ID: Ashley Goodman, female    DOB: Aug 03, 1956  Age: 66 y.o. MRN: [id number] ? ?CC:  ?Chief Complaint  ?Patient presents with  ? Establish Care  ?  Knot on left arm/ pain in feet/ gaining weight stomach issues   ? ? ?HPI ?Ashley Goodman presents for establish care ? ?Patient used in person interpreter from [employer] ? ?Knot on arm: on the left arm on the anterior side. Noticed a long time ago and it has grown and now starting to hurt. When she lifts something heavy or after activity. No OTC treatment. No numbness or tingling or weakness. States she has used warm compresses over the past few days with some relief  ? ?Pain in feet:on the doral surface of both feet. States that she thought the shoes were too tight but got new shoes and that has helped some. Has been on gabapentin in the pst with out great relief. Gabapentin made her feel at unease. She has seen a foot doctor in the past. Has not seen podiatry here for the issues. ?Has tried meloxicam in the past and was not helpful. Hurts less now.  ? ?Weight gain:states that she has gained approx 10 pounds in 2 months. Eating more per patient report. 2-3 meals a day. Heavist meal is breakfast. States that will eat apple or mild for snack. Patient states that she does exercise states that she walks for approx 4 times a week at 30 minutes at a normal pace. She will also do light weight ? ?Burning pain in her stomach for 2 months that feels like needles. States that it is worse with eating and drinking. Not worse with laying down ? ?PHQ9 SCORE ONLY 12/10/2021 06/09/2020 12/06/2019  ?PHQ-9 Total Score 0 0 0  ?  ?GAD 7 : Generalized Anxiety Score 12/10/2021 06/09/2020  ?Nervous, Anxious, on Edge 1 0  ?Control/stop worrying 1 0  ?Worry too much - different things 1 0  ?Trouble relaxing 3 0  ?Restless 0 0  ?Easily annoyed or irritable 3 0  ?Afraid - awful might happen 0 0  ?Total GAD 7 Score  9 0  ?Anxiety Difficulty Very difficult -  ? ?  ? ?Past Medical History:  ?Diagnosis Date  ? Hyperlipidemia   ? Thyroid cyst   ? Thyroid disease   ? ? ?Past Surgical History:  ?Procedure Laterality Date  ? BREAST BIOPSY Left 2004  ? neg  ? BREAST EXCISIONAL BIOPSY Right 06/22/2012  ? neg/re-excision  ? BREAST EXCISIONAL BIOPSY Right 01/03/2012  ? neg phyllodes tumor w/ margin involvment  ? colonoscopy  09/21/2012  ? due for repeat 09/21/2022  ? ? ?Family History  ?Problem Relation Age of Onset  ? Hyperlipidemia Mother   ? Alzheimer's disease Mother   ? Hypertension Mother   ? Arthritis Mother   ? Heart Problems Father   ? Breast cancer Neg Hx   ? ? ?Social History  ? ?Socioeconomic History  ? Marital status: Divorced  ?  Spouse name: Not on file  ? Number of children: Not on file  ? Years of education: Not on file  ? Highest education level: Not on file  ?Occupational History  ? Not on file  ?Tobacco Use  ? Smoking status: Never  ? Smokeless tobacco: Never  ?Substance and Sexual Activity  ? Alcohol use: Never  ? Drug use: Never  ? Sexual activity: Not Currently  ?  Birth control/protection: None  ?Other Topics Concern  ? Not on file  ?Social History Narrative  ? Works [employer]   ? Needs interpreter spanish speaking   ? ?Social Determinants of Health  ? ?Financial Resource Strain: Not on file  ?Food Insecurity: Not on file  ?Transportation Needs: Not on file  ?Physical Activity: Not on file  ?Stress: Not on file  ?Social Connections: Not on file  ?Intimate Partner Violence: Not on file  ? ? ?ROS ?Review of Systems  ?Constitutional:  Negative for chills and fever.  ?Respiratory:  Negative for cough and shortness of breath.   ?Cardiovascular:  Negative for chest pain.  ?Gastrointestinal:  Negative for diarrhea, nausea and vomiting.  ?     BM daily  ?Genitourinary:  Negative for difficulty urinating.  ?Neurological:  Negative for weakness, numbness and headaches.  ? ?Objective:  ? ?Today's Vitals: BP 110/80 (BP Location:  Left Arm, Patient Position: Sitting, Cuff Size: Normal)   Pulse 72   Temp 99 ?F (37.2 ?C) (Oral)   Ht 5' (1.524 m)   Wt 153 lb (69.4 kg)   SpO2 95%   BMI 29.88 kg/m?  ? ?Physical Exam ?Vitals and nursing note reviewed.  ?Constitutional:   ?   Appearance: Normal appearance.  ?Cardiovascular:  ?   Rate and Rhythm: Normal rate and regular rhythm.  ?   Pulses: Normal pulses.     ?     Dorsalis pedis pulses are 2+ on the right side and 2+ on the left side.  ?   Heart sounds: Normal heart sounds.  ?Pulmonary:  ?   Effort: Pulmonary effort is normal.  ?   Breath sounds: Normal breath sounds.  ?Abdominal:  ?   General: Bowel sounds are normal. There is no distension.  ?   Palpations: There is no mass.  ?   Tenderness: There is no abdominal tenderness.  ?   Hernia: No hernia is present.  ?Musculoskeletal:     ?   General: Deformity present. No signs of injury.  ?     Arms: ? ?   Right lower leg: No edema.  ?   Left lower leg: No edema.  ?     Feet: ? ?   Comments: Plum sized soft mass that is mobile and non tender. Non discrete  ?Feet:  ?   Comments: Discomfort where circles are. No TTP ?Lymphadenopathy:  ?   Cervical: No cervical adenopathy.  ?Skin: ?   General: Skin is warm.  ?Neurological:  ?   General: No focal deficit present.  ?   Mental Status: She is alert.  ? ? ?Assessment & Plan:  ? ?Problem List Items Addressed This Visit   ? ?  ? Endocrine  ? Hypothyroidism  ?  Historical diagnosis patient currently maintained on levothyroxine 25 mcg daily.  Upon questioning patient is taking medication appropriately she has gained weight over the past 2 months since last time her thyroid was checked its been over a year ago.  Pending labs continue taking medication as prescribed ?  ?  ?  ? Musculoskeletal and Integument  ? Osteopenia  ?  Patient has history of osteopenia last DEXA was 04/15/2020.  Patient was told to be on vitamin D and calcium.  She states she has been stopped did note that patient had a high calcium  level.  Pending labs today we will have patient repeat DEXA scan in approximately 6 months. ?  ?  ? Relevant Orders  ?  VITAMIN D 25 Hydroxy (Vit-D Deficiency, Fractures)  ?  ? Other  ? Mass of arm, left - Primary  ?  Discrete mass to the left anterior distal AC area.  We will set up ultrasound for further characterization.  Order placed pending result ?  ?  ? Relevant Orders  ? US SOFT TISSUE UPPER EXTREMITY LIMITED LEFT (NON-VASCULAR)  ? GAD (generalized anxiety disorder)  ?  Patient sounds to have some generalized anxiety disorder.  We will try patient on hydroxyzine 25 mg nightly as needed. ?  ?  ? Relevant Medications  ? hydrOXYzine (VISTARIL) 25 MG capsule  ? Weight gain  ?  Patient has been gaining weight and unsure as to why.  We will check basic labs including thyroid as she does have history of hypothyroidism and is medicated.  Pending lab results ?  ?  ? Relevant Orders  ? Hemoglobin A1c  ? Lipid panel  ? Heartburn  ?  Patient's description sounds like heartburn.  Will trial some omeprazole and see if this helps patient's burning sensation. ?  ?  ? Relevant Medications  ? omeprazole (PRILOSEC) 20 MG capsule  ? Other Relevant Orders  ? TSH  ? Establishing care with new doctor, encounter for  ? Relevant Orders  ? CBC  ? Comprehensive metabolic panel  ? Bilateral foot pain  ?  Has been going on for some time.  Patient has been evaluated by podiatry in the past and written meloxicam.  Patient states meloxicam was not beneficial and so she stopped taking it.  Patient also states that she thought her shoes were too tight so she got wider looser shoes and this has helped.  No discrete abnormality on exam today we will refer back to podiatry at patient's request.  Ambulatory referral placed today ?  ?  ? Relevant Orders  ? Ambulatory referral to Podiatry  ? ?Send to [place] ?Outpatient Encounter Medications as of 12/10/2021  ?Medication Sig  ? estradiol (ESTRACE) 0.1 MG/GM vaginal cream Place 1 Applicatorful  vaginally 3 (three) times a week. 1-3 x per week  ? levothyroxine (SYNTHROID) 25 MCG tablet TAKE 1 TABLET (25 MCG TOTAL) BY MOUTH DAILY BEFORE BREAKFAST 30 MINUTES BEFORE FOOD  ? Carboxymethylcellulose Sod PF (LUBRICATIN

## 2021-12-10 NOTE — Assessment & Plan Note (Signed)
Patient has been gaining weight and unsure as to why.  We will check basic labs including thyroid as she does have history of hypothyroidism and is medicated.  Pending lab results ?

## 2021-12-10 NOTE — Assessment & Plan Note (Signed)
Historical diagnosis patient currently maintained on levothyroxine 25 mcg daily.  Upon questioning patient is taking medication appropriately she has gained weight over the past 2 months since last time her thyroid was checked its been over a year ago.  Pending labs continue taking medication as prescribed ?

## 2021-12-10 NOTE — Patient Instructions (Signed)
Nice to see you today ?I will be in touch with your lab results ?Follow up with me in 6 months for a physical  ?Sooner if you need me ?

## 2021-12-10 NOTE — Assessment & Plan Note (Signed)
Patient's description sounds like heartburn.  Will trial some omeprazole and see if this helps patient's burning sensation. ?

## 2021-12-10 NOTE — Assessment & Plan Note (Signed)
Has been going on for some time.  Patient has been evaluated by podiatry in the past and written meloxicam.  Patient states meloxicam was not beneficial and so she stopped taking it.  Patient also states that she thought her shoes were too tight so she got wider looser shoes and this has helped.  No discrete abnormality on exam today we will refer back to podiatry at patient's request.  Ambulatory referral placed today ?

## 2021-12-10 NOTE — Assessment & Plan Note (Signed)
Discrete mass to the left anterior distal AC area.  We will set up ultrasound for further characterization.  Order placed pending result ?

## 2021-12-10 NOTE — Assessment & Plan Note (Signed)
Patient sounds to have some generalized anxiety disorder.  We will try patient on hydroxyzine 25 mg nightly as needed. ?

## 2021-12-11 LAB — LIPID PANEL
Cholesterol: 213 mg/dL — ABNORMAL HIGH (ref 0–200)
HDL: 36.3 mg/dL — ABNORMAL LOW (ref >39.00)
NonHDL: 176.57
Total CHOL/HDL Ratio: 6
Triglycerides: 375 mg/dL — ABNORMAL HIGH (ref 0.0–149.0)
VLDL: 75 mg/dL — ABNORMAL HIGH (ref 0.0–40.0)

## 2021-12-11 LAB — CBC
HCT: 38.6 % (ref 36.0–46.0)
Hemoglobin: 13 g/dL (ref 12.0–15.0)
MCHC: 33.8 g/dL (ref 30.0–36.0)
MCV: 90.5 fl (ref 78.0–100.0)
Platelets: 216 10*3/uL (ref 150.0–400.0)
RBC: 4.26 Mil/uL (ref 3.87–5.11)
RDW: 13 % (ref 11.5–15.5)
WBC: 5.6 10*3/uL (ref 4.0–10.5)

## 2021-12-11 LAB — COMPREHENSIVE METABOLIC PANEL
ALT: 22 U/L (ref 0–35)
AST: 25 U/L (ref 0–37)
Albumin: 4 g/dL (ref 3.5–5.2)
Alkaline Phosphatase: 146 U/L — ABNORMAL HIGH (ref 39–117)
BUN: 22 mg/dL (ref 6–23)
CO2: 30 mEq/L (ref 19–32)
Calcium: 10.2 mg/dL (ref 8.4–10.5)
Chloride: 106 mEq/L (ref 96–112)
Creatinine, Ser: 0.9 mg/dL (ref 0.40–1.20)
GFR: 66.11 mL/min (ref >60.00)
Glucose, Bld: 98 mg/dL (ref 70–99)
Potassium: 3.9 mEq/L (ref 3.5–5.1)
Sodium: 141 mEq/L (ref 135–145)
Total Bilirubin: 0.3 mg/dL (ref 0.2–1.2)
Total Protein: 6.4 g/dL (ref 6.0–8.3)

## 2021-12-11 LAB — LDL CHOLESTEROL, DIRECT: Direct LDL: 129 mg/dL

## 2021-12-11 LAB — HEMOGLOBIN A1C: Hgb A1c MFr Bld: 5.8 % (ref 4.6–6.5)

## 2021-12-11 LAB — TSH: TSH: 2.07 u[IU]/mL (ref 0.35–5.50)

## 2021-12-11 LAB — VITAMIN D 25 HYDROXY (VIT D DEFICIENCY, FRACTURES): VITD: 31.32 ng/mL (ref 30.00–100.00)

## 2021-12-23 ENCOUNTER — Other Ambulatory Visit: Payer: Self-pay | Admitting: Podiatry

## 2021-12-23 ENCOUNTER — Ambulatory Visit (INDEPENDENT_AMBULATORY_CARE_PROVIDER_SITE_OTHER): Payer: Medicare HMO

## 2021-12-23 ENCOUNTER — Encounter: Payer: Self-pay | Admitting: Podiatry

## 2021-12-23 ENCOUNTER — Ambulatory Visit: Payer: Medicare HMO | Admitting: Podiatry

## 2021-12-23 DIAGNOSIS — G5793 Unspecified mononeuropathy of bilateral lower limbs: Secondary | ICD-10-CM

## 2021-12-23 DIAGNOSIS — M779 Enthesopathy, unspecified: Secondary | ICD-10-CM

## 2021-12-23 NOTE — Patient Instructions (Signed)
Look for Voltaren gel at the pharmacy over the counter or online (also known as diclofenac 1% gel). Apply to the painful areas 3-4x daily with the supplied dosing card. Allow to dry for 10 minutes before going into socks/shoes  

## 2021-12-25 NOTE — Progress Notes (Signed)
?  Subjective:  ?Patient ID: Ashley Goodman, female    DOB: [birth year],  MRN: [id number] ? ?Chief Complaint  ?Patient presents with  ? Foot Pain  ? ? ?66 y.o. female presents with the above complaint. History confirmed with patient.  She has had pain in both feet especially in the top of the midfoot.  The right is worse than the left.  The sharp pain with standing or walking.  She thought maybe it was a tight pair shoes and she switched shoes and has improved since wearing a looser type shoe. ? ?Objective:  ?Physical Exam: ?warm, good capillary refill, no trophic changes or ulcerative lesions, normal DP and PT pulses, and normal sensory exam. ?Left Foot: normal exam, no swelling, tenderness, instability; ligaments intact, full range of motion of all ankle/foot joints ?Right Foot: normal exam, no swelling, tenderness, instability; ligaments intact, full range of motion of all ankle/foot joints ? ?No images are attached to the encounter. ? ?Radiographs: ?Multiple views x-ray of both feet: no fracture, dislocation, swelling or degenerative changes noted ?Assessment:  ? ?1. Peripheral neuritis of both feet   ? ? ? ?Plan:  ?Patient was evaluated and treated and all questions answered. ? ?I discussed with her that I think she likely has a dorsal compression neuritis of the dorsal cutaneous nerves.  I discussed avoiding pressure on shoes here.  We also discussed using topical lidocaine cream for pain relief.  Think this should continue to improve she will return to see me as needed we also discussed possibility of injection across the top of the foot if it does not improve. ? ?Return if symptoms worsen or fail to improve.  ? ?

## 2022-01-30 ENCOUNTER — Other Ambulatory Visit: Payer: Self-pay | Admitting: Internal Medicine

## 2022-01-30 DIAGNOSIS — E039 Hypothyroidism, unspecified: Secondary | ICD-10-CM

## 2022-04-19 DIAGNOSIS — M542 Cervicalgia: Secondary | ICD-10-CM | POA: Diagnosis not present

## 2022-04-27 DIAGNOSIS — H5203 Hypermetropia, bilateral: Secondary | ICD-10-CM | POA: Diagnosis not present

## 2022-04-27 DIAGNOSIS — Z0100 Encounter for examination of eyes and vision without abnormal findings: Secondary | ICD-10-CM | POA: Diagnosis not present

## 2022-06-04 ENCOUNTER — Other Ambulatory Visit: Payer: Self-pay | Admitting: Internal Medicine

## 2022-06-11 ENCOUNTER — Encounter: Payer: Self-pay | Admitting: Nurse Practitioner

## 2022-06-11 ENCOUNTER — Ambulatory Visit (INDEPENDENT_AMBULATORY_CARE_PROVIDER_SITE_OTHER): Payer: Medicare HMO | Admitting: Nurse Practitioner

## 2022-06-11 VITALS — BP 124/60 | HR 63 | Ht 60.0 in | Wt 154.3 lb

## 2022-06-11 DIAGNOSIS — E039 Hypothyroidism, unspecified: Secondary | ICD-10-CM | POA: Diagnosis not present

## 2022-06-11 DIAGNOSIS — M8580 Other specified disorders of bone density and structure, unspecified site: Secondary | ICD-10-CM

## 2022-06-11 NOTE — Progress Notes (Signed)
New Patient Office Visit  Subjective    Patient ID: Ashley Goodman, female    DOB: May 19, 1956  Age: 66 y.o. MRN: [id number]  CC:  Chief Complaint  Patient presents with   New Patient (Initial Visit)    HPI Ashley Goodman presents to establish care. 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  She has h/o hypothyrodism, hyperlipidemia and osteopenia.   Outpatient Encounter Medications as of 06/11/2022  Medication Sig   levothyroxine (SYNTHROID) 25 MCG tablet TAKE 1 TABLET BY MOUTH BEFORE BREAKFAST 30 MIN BEFORE FOOD   estradiol (ESTRACE) 0.1 MG/GM vaginal cream Place 1 Applicatorful vaginally 3 (three) times a week. 1-3 x per week (Patient not taking: Reported on 06/11/2022)   [DISCONTINUED] Carboxymethylcellulose Sod PF (LUBRICATING PLUS EYE DROPS) 0.5 % SOLN place 2 drops in each eye 3 times a day (Patient not taking: Reported on 12/10/2021)   [DISCONTINUED] cholecalciferol (VITAMIN D3) 25 MCG (1000 UNIT) tablet Take 2,000 Units by mouth daily. (Patient not taking: Reported on 12/10/2021)   [DISCONTINUED] Cholecalciferol 25 MCG (1000 UT) tablet Take by mouth. (Patient not taking: Reported on 12/10/2021)   [DISCONTINUED] cyclobenzaprine (FLEXERIL) 5 MG tablet Take 1 tablet (5 mg total) by mouth 3 (three) times daily as needed for muscle spasms. (Patient not taking: Reported on 12/10/2021)   [DISCONTINUED] fluticasone (FLONASE) 50 MCG/ACT nasal spray 2 sprays in each nostril once daily (Patient not taking: Reported on 12/10/2021)   [DISCONTINUED] hydrOXYzine (VISTARIL) 25 MG capsule Take 1 capsule (25 mg total) by mouth at bedtime as needed.   [DISCONTINUED] ketorolac (ACULAR) 0.5 % ophthalmic solution PLACE ONE DROP INTO AFFECTED EYE(S) FOUR TIMES A DAY (Patient not taking: Reported on 12/10/2021)   [DISCONTINUED] meloxicam (MOBIC) 15 MG tablet Take 1 tablet (15 mg total) by mouth daily. (Patient not taking: Reported on 12/10/2021)   [DISCONTINUED]  meloxicam (MOBIC) 15 MG tablet Take 1 tablet by mouth daily (Patient not taking: Reported on 12/10/2021)   [DISCONTINUED] metoprolol tartrate (LOPRESSOR) 25 MG tablet TAKE 1 TABLET (25 MG TOTAL) BY MOUTH ONCE FOR 1 DOSE. TAKE 2 HOURS PRIOR TO YOUR CT SCAN.   [DISCONTINUED] omeprazole (PRILOSEC) 20 MG capsule Take 1 capsule (20 mg total) by mouth daily.   No facility-administered encounter medications on file as of 06/11/2022.    Past Medical History:  Diagnosis Date   Hyperlipidemia    Thyroid cyst    Thyroid disease     Past Surgical History:  Procedure Laterality Date   BREAST BIOPSY Left 2004   neg   BREAST EXCISIONAL BIOPSY Right 06/22/2012   neg/re-excision   BREAST EXCISIONAL BIOPSY Right 01/03/2012   neg phyllodes tumor w/ margin involvment   colonoscopy  09/21/2012   due for repeat 09/21/2022    Family History  Problem Relation Age of Onset   Hyperlipidemia Mother    Alzheimer's disease Mother    Hypertension Mother    Arthritis Mother    Heart Problems Father    Breast cancer Neg Hx     Social History   Socioeconomic History   Marital status: Divorced    Spouse name: Not on file   Number of children: Not on file   Years of education: Not on file   Highest education level: Not on file  Occupational History   Not on file  Tobacco Use   Smoking status: Never   Smokeless tobacco: Never  Substance and Sexual Activity   Alcohol use: Never  Drug use: Never   Sexual activity: Not Currently    Birth control/protection: None  Other Topics Concern   Not on file  Social History Narrative   Works [employer]    Needs interpreter spanish speaking    Social Determinants of Health   Financial Resource Strain: Not on file  Food Insecurity: Not on file  Transportation Needs: Not on file  Physical Activity: Not on file  Stress: Not on file  Social Connections: Not on file  Intimate Partner Violence: Not on file    Review of Systems  Constitutional: Negative.    HENT: Negative.    Eyes: Negative.   Respiratory:  Negative for cough and shortness of breath.   Cardiovascular:  Negative for chest pain and leg swelling.  Genitourinary: Negative.   Musculoskeletal:  Positive for joint pain and neck pain.  Skin: Negative.   Neurological:  Negative for dizziness, tingling and headaches.  Psychiatric/Behavioral:  Negative for depression, substance abuse and suicidal ideas.         Objective    BP 124/60   Pulse 63   Ht 5' (1.524 m)   Wt 154 lb 4.8 oz (70 kg)   BMI 30.13 kg/m   Physical Exam Constitutional:      Appearance: Normal appearance. She is obese.  HENT:     Right Ear: Tympanic membrane normal.     Left Ear: Tympanic membrane normal.     Nose: Nose normal.  Eyes:     Extraocular Movements: Extraocular movements intact.     Conjunctiva/sclera: Conjunctivae normal.     Pupils: Pupils are equal, round, and reactive to light.  Neurological:     Mental Status: She is alert.         Assessment & Plan:   Problem List Items Addressed This Visit       Endocrine   Hypothyroidism - Primary    Stable at present. Continue levothyroxine 25 mcg daily.        Musculoskeletal and Integument   Osteopenia    Advised patient to continue vitamin D and calcium supplement. We will send referral for bone density at next appointment.       No follow-ups on file.   Kara Dies, NP

## 2022-06-14 ENCOUNTER — Ambulatory Visit: Payer: Medicare HMO | Admitting: Nurse Practitioner

## 2022-06-16 ENCOUNTER — Ambulatory Visit: Payer: Medicare HMO

## 2022-06-21 ENCOUNTER — Ambulatory Visit: Payer: Medicare HMO | Admitting: Nurse Practitioner

## 2022-06-24 ENCOUNTER — Encounter: Payer: Self-pay | Admitting: Nurse Practitioner

## 2022-06-24 NOTE — Assessment & Plan Note (Signed)
Stable at present. Continue levothyroxine 25 mcg daily. 

## 2022-06-24 NOTE — Assessment & Plan Note (Signed)
Advised patient to continue vitamin D and calcium supplement. We will send referral for bone density at next appointment.

## 2022-06-29 ENCOUNTER — Other Ambulatory Visit: Payer: Self-pay | Admitting: Internal Medicine

## 2022-06-29 DIAGNOSIS — Z1231 Encounter for screening mammogram for malignant neoplasm of breast: Secondary | ICD-10-CM

## 2022-07-02 ENCOUNTER — Encounter: Payer: Self-pay | Admitting: Nurse Practitioner

## 2022-07-02 ENCOUNTER — Ambulatory Visit (INDEPENDENT_AMBULATORY_CARE_PROVIDER_SITE_OTHER): Payer: Medicare HMO | Admitting: Nurse Practitioner

## 2022-07-02 VITALS — BP 114/72 | HR 73 | Ht 60.0 in | Wt 152.3 lb

## 2022-07-02 DIAGNOSIS — R748 Abnormal levels of other serum enzymes: Secondary | ICD-10-CM | POA: Diagnosis not present

## 2022-07-02 DIAGNOSIS — E785 Hyperlipidemia, unspecified: Secondary | ICD-10-CM

## 2022-07-02 DIAGNOSIS — R7303 Prediabetes: Secondary | ICD-10-CM | POA: Diagnosis not present

## 2022-07-02 DIAGNOSIS — M8580 Other specified disorders of bone density and structure, unspecified site: Secondary | ICD-10-CM

## 2022-07-02 NOTE — Assessment & Plan Note (Signed)
Advised patient to take vitamin D3 and calcium supplements. Advised her to do stretching exercises and use hot and cold compresses alternatively. DEXA scan ordered.

## 2022-07-02 NOTE — Progress Notes (Signed)
Established Patient Office Visit  Subjective:  Patient ID: Ashley Goodman, female    DOB: 1956-08-15  Age: 66 y.o. MRN: [id number]  CC:  Chief Complaint  Patient presents with   Follow-up     HPI [name] presents for follow up. She has pending labs. Patient states that she will get the labs done next week. She complains of knee and neck pain.   HPI   Past Medical History:  Diagnosis Date   Hyperlipidemia    Thyroid cyst    Thyroid disease     Past Surgical History:  Procedure Laterality Date   BREAST BIOPSY Left 2004   neg   BREAST EXCISIONAL BIOPSY Right 06/22/2012   neg/re-excision   BREAST EXCISIONAL BIOPSY Right 01/03/2012   neg phyllodes tumor w/ margin involvment   colonoscopy  09/21/2012   due for repeat 09/21/2022    Family History  Problem Relation Age of Onset   Hyperlipidemia Mother    Alzheimer's disease Mother    Hypertension Mother    Arthritis Mother    Heart Problems Father    Breast cancer Neg Hx     Social History   Socioeconomic History   Marital status: Divorced    Spouse name: Not on file   Number of children: Not on file   Years of education: Not on file   Highest education level: Not on file  Occupational History   Not on file  Tobacco Use   Smoking status: Never   Smokeless tobacco: Never  Substance and Sexual Activity   Alcohol use: Never   Drug use: Never   Sexual activity: Not Currently    Birth control/protection: None  Other Topics Concern   Not on file  Social History Narrative   Works [employer]    Needs interpreter spanish speaking    Social Determinants of Health   Financial Resource Strain: Not on file  Food Insecurity: Not on file  Transportation Needs: Not on file  Physical Activity: Not on file  Stress: Not on file  Social Connections: Not on file  Intimate Partner Violence: Not on file     Outpatient Medications Prior to Visit  Medication Sig Dispense Refill    hydrOXYzine (ATARAX) 25 MG tablet Take 25 mg by mouth at bedtime as needed.     levothyroxine (SYNTHROID) 25 MCG tablet TAKE 1 TABLET BY MOUTH BEFORE BREAKFAST 30 MIN BEFORE FOOD 90 tablet 3   estradiol (ESTRACE) 0.1 MG/GM vaginal cream Place 1 Applicatorful vaginally 3 (three) times a week. 1-3 x per week (Patient not taking: Reported on 06/11/2022) 42.5 g 5   No facility-administered medications prior to visit.    No Known Allergies  ROS Review of Systems  Constitutional: Negative.   HENT: Negative.    Eyes: Negative.   Respiratory:  Negative for chest tightness and shortness of breath.   Cardiovascular:  Negative for chest pain.  Gastrointestinal: Negative.   Genitourinary: Negative.   Musculoskeletal:  Positive for arthralgias and neck pain.  Neurological: Negative.   Psychiatric/Behavioral: Negative.        Objective:    Physical Exam Constitutional:      Appearance: Normal appearance. She is obese.  HENT:     Head: Normocephalic.     Right Ear: Tympanic membrane normal.     Left Ear: Tympanic membrane normal.     Nose: Nose normal.     Mouth/Throat:     Mouth: Mucous membranes are moist.  Pharynx: Oropharynx is clear.  Eyes:     Extraocular Movements: Extraocular movements intact.     Conjunctiva/sclera: Conjunctivae normal.     Pupils: Pupils are equal, round, and reactive to light.  Cardiovascular:     Rate and Rhythm: Normal rate and regular rhythm.     Pulses: Normal pulses.     Heart sounds: Normal heart sounds.  Pulmonary:     Effort: Pulmonary effort is normal. No respiratory distress.     Breath sounds: Normal breath sounds. No rhonchi.  Abdominal:     General: Bowel sounds are normal.     Palpations: Abdomen is soft. There is no mass.     Tenderness: There is no abdominal tenderness.     Hernia: No hernia is present.  Musculoskeletal:        General: Normal range of motion.     Cervical back: Neck supple. No tenderness.  Skin:    General:  Skin is warm.     Capillary Refill: Capillary refill takes less than 2 seconds.  Neurological:     General: No focal deficit present.     Mental Status: She is alert and oriented to person, place, and time. Mental status is at baseline.  Psychiatric:        Mood and Affect: Mood normal.        Behavior: Behavior normal.        Thought Content: Thought content normal.        Judgment: Judgment normal.     BP 114/72   Pulse 73   Ht 5' (1.524 m)   Wt 152 lb 4.8 oz (69.1 kg)   BMI 29.74 kg/m  Wt Readings from Last 3 Encounters:  07/02/22 152 lb 4.8 oz (69.1 kg)  06/11/22 154 lb 4.8 oz (70 kg)  12/10/21 153 lb (69.4 kg)     Health Maintenance  Topic Date Due   Zoster Vaccines- Shingrix (1 of 2) Never done   Pneumonia Vaccine 75+ Years old (1 - PCV) Never done   COVID-19 Vaccine (3 - Moderna risk series) 11/30/2019   INFLUENZA VACCINE  07/03/2023 (Originally 04/27/2022)   MAMMOGRAM  04/21/2023   COLONOSCOPY (Pts 45-61yrs Insurance coverage will need to be confirmed)  09/09/2023   TETANUS/TDAP  04/23/2031   DEXA SCAN  Completed   Hepatitis C Screening  Completed   HPV VACCINES  Aged Out    There are no preventive care reminders to display for this patient.  Lab Results  Component Value Date   TSH 2.07 12/10/2021   Lab Results  Component Value Date   WBC 5.6 12/10/2021   HGB 13.0 12/10/2021   HCT 38.6 12/10/2021   MCV 90.5 12/10/2021   PLT 216.0 12/10/2021   Lab Results  Component Value Date   NA 141 12/10/2021   K 3.9 12/10/2021   CO2 30 12/10/2021   GLUCOSE 98 12/10/2021   BUN 22 12/10/2021   CREATININE 0.90 12/10/2021   BILITOT 0.3 12/10/2021   ALKPHOS 146 (H) 12/10/2021   AST 25 12/10/2021   ALT 22 12/10/2021   PROT 6.4 12/10/2021   ALBUMIN 4.0 12/10/2021   CALCIUM 10.2 12/10/2021   ANIONGAP 7 09/02/2020   GFR 66.11 12/10/2021   Lab Results  Component Value Date   CHOL 213 (H) 12/10/2021   Lab Results  Component Value Date   HDL 36.30 (L)  12/10/2021   Lab Results  Component Value Date   LDLCALC 134 (H) 06/10/2020   Lab Results  Component Value Date   TRIG 375.0 (H) 12/10/2021   Lab Results  Component Value Date   CHOLHDL 6 12/10/2021   Lab Results  Component Value Date   HGBA1C 5.8 12/10/2021      Assessment & Plan:   Problem List Items Addressed This Visit       Musculoskeletal and Integument   Osteopenia - Primary    Advised patient to take vitamin D3 and calcium supplements. Advised her to do stretching exercises and use hot and cold compresses alternatively. DEXA scan ordered.      Relevant Orders   HM DEXA SCAN (Completed)     Other   Hyperlipidemia   Elevated liver enzymes     No orders of the defined types were placed in this encounter.    Follow-up: No follow-ups on file.    Kara Dies, NP

## 2022-07-02 NOTE — Assessment & Plan Note (Signed)
Hemoglobin A1c 5.8 on 12/10/2021. We will repeat A1c. Advised patient to eat healthy and avoid simple carbs. Encouraged her to follow regular exercise routine.

## 2022-07-02 NOTE — Assessment & Plan Note (Signed)
We will repeat labs.

## 2022-07-02 NOTE — Assessment & Plan Note (Signed)
She had elevated cholesterol, LDL and triglyceride level on 12/10/2021. We will do the fasting labs.

## 2022-07-06 ENCOUNTER — Ambulatory Visit (INDEPENDENT_AMBULATORY_CARE_PROVIDER_SITE_OTHER): Payer: Medicare HMO | Admitting: *Deleted

## 2022-07-06 DIAGNOSIS — Z0189 Encounter for other specified special examinations: Secondary | ICD-10-CM | POA: Diagnosis not present

## 2022-07-06 DIAGNOSIS — R7303 Prediabetes: Secondary | ICD-10-CM | POA: Diagnosis not present

## 2022-07-06 DIAGNOSIS — R748 Abnormal levels of other serum enzymes: Secondary | ICD-10-CM | POA: Diagnosis not present

## 2022-07-06 DIAGNOSIS — E785 Hyperlipidemia, unspecified: Secondary | ICD-10-CM | POA: Diagnosis not present

## 2022-07-07 LAB — HEPATIC FUNCTION PANEL
AG Ratio: 1.5 (calc) (ref 1.0–2.5)
ALT: 18 U/L (ref 6–29)
AST: 21 U/L (ref 10–35)
Albumin: 4 g/dL (ref 3.6–5.1)
Alkaline phosphatase (APISO): 128 U/L (ref 37–153)
Bilirubin, Direct: 0.1 mg/dL (ref 0.0–0.2)
Globulin: 2.7 g/dL (calc) (ref 1.9–3.7)
Indirect Bilirubin: 0.3 mg/dL (calc) (ref 0.2–1.2)
Total Bilirubin: 0.4 mg/dL (ref 0.2–1.2)
Total Protein: 6.7 g/dL (ref 6.1–8.1)

## 2022-07-07 LAB — LIPID PANEL
Cholesterol: 195 mg/dL (ref <200)
HDL: 40 mg/dL — ABNORMAL LOW (ref >=50)
LDL Cholesterol (Calc): 129 mg/dL (calc) — ABNORMAL HIGH
Non-HDL Cholesterol (Calc): 155 mg/dL (calc) — ABNORMAL HIGH (ref <130)
Total CHOL/HDL Ratio: 4.9 (calc) (ref <5.0)
Triglycerides: 148 mg/dL (ref <150)

## 2022-07-07 LAB — HEMOGLOBIN A1C
Hgb A1c MFr Bld: 5.7 % of total Hgb — ABNORMAL HIGH (ref <5.7)
Mean Plasma Glucose: 117 mg/dL
eAG (mmol/L): 6.5 mmol/L

## 2022-07-12 DIAGNOSIS — M542 Cervicalgia: Secondary | ICD-10-CM | POA: Diagnosis not present

## 2022-07-12 DIAGNOSIS — M9901 Segmental and somatic dysfunction of cervical region: Secondary | ICD-10-CM | POA: Diagnosis not present

## 2022-07-16 DIAGNOSIS — M542 Cervicalgia: Secondary | ICD-10-CM | POA: Diagnosis not present

## 2022-07-16 DIAGNOSIS — M9901 Segmental and somatic dysfunction of cervical region: Secondary | ICD-10-CM | POA: Diagnosis not present

## 2022-07-26 ENCOUNTER — Encounter (INDEPENDENT_AMBULATORY_CARE_PROVIDER_SITE_OTHER): Payer: Self-pay

## 2022-07-27 ENCOUNTER — Ambulatory Visit
Admission: RE | Admit: 2022-07-27 | Discharge: 2022-07-27 | Disposition: A | Discharge status: Home / Self Care | Payer: Medicare HMO | Source: Ambulatory Visit | Attending: Internal Medicine | Admitting: Internal Medicine

## 2022-07-27 DIAGNOSIS — Z1231 Encounter for screening mammogram for malignant neoplasm of breast: Secondary | ICD-10-CM | POA: Insufficient documentation

## 2022-08-11 ENCOUNTER — Other Ambulatory Visit: Payer: Self-pay

## 2022-08-11 DIAGNOSIS — E039 Hypothyroidism, unspecified: Secondary | ICD-10-CM

## 2022-08-11 MED ORDER — LEVOTHYROXINE SODIUM 25 MCG PO TABS: ORAL_TABLET | ORAL | 1 refills | Status: DC

## 2022-08-30 ENCOUNTER — Other Ambulatory Visit: Payer: Self-pay

## 2022-08-30 DIAGNOSIS — S46811D Strain of other muscles, fascia and tendons at shoulder and upper arm level, right arm, subsequent encounter: Secondary | ICD-10-CM | POA: Diagnosis not present

## 2022-08-30 DIAGNOSIS — M542 Cervicalgia: Secondary | ICD-10-CM | POA: Diagnosis not present

## 2022-08-30 MED ORDER — MELOXICAM 15 MG PO TABS
15.0000 mg | ORAL_TABLET | ORAL | 0 refills | Status: AC
  Filled 2022-08-30: qty 30, 30d supply, fill #0

## 2022-08-30 MED ORDER — METHOCARBAMOL 500 MG PO TABS
500.0000 mg | ORAL_TABLET | Freq: Every evening | ORAL | 1 refills | Status: AC
  Filled 2022-08-30: qty 30, 30d supply, fill #0

## 2022-09-07 ENCOUNTER — Other Ambulatory Visit: Payer: Self-pay

## 2022-10-06 DIAGNOSIS — S60221A Contusion of right hand, initial encounter: Secondary | ICD-10-CM | POA: Diagnosis not present

## 2022-10-06 DIAGNOSIS — S59291A Other physeal fracture of lower end of radius, right arm, initial encounter for closed fracture: Secondary | ICD-10-CM | POA: Diagnosis not present

## 2022-10-06 DIAGNOSIS — W182XXA Fall in (into) shower or empty bathtub, initial encounter: Secondary | ICD-10-CM | POA: Diagnosis not present

## 2022-10-06 DIAGNOSIS — S52591A Other fractures of lower end of right radius, initial encounter for closed fracture: Secondary | ICD-10-CM | POA: Diagnosis not present

## 2022-10-06 DIAGNOSIS — S52501A Unspecified fracture of the lower end of right radius, initial encounter for closed fracture: Secondary | ICD-10-CM | POA: Diagnosis not present

## 2022-10-06 DIAGNOSIS — Y92009 Unspecified place in unspecified non-institutional (private) residence as the place of occurrence of the external cause: Secondary | ICD-10-CM | POA: Diagnosis not present

## 2022-10-06 DIAGNOSIS — S60211A Contusion of right wrist, initial encounter: Secondary | ICD-10-CM | POA: Diagnosis not present

## 2022-10-11 DIAGNOSIS — S52531A Colles' fracture of right radius, initial encounter for closed fracture: Secondary | ICD-10-CM | POA: Diagnosis not present

## 2022-10-18 DIAGNOSIS — S52531D Colles' fracture of right radius, subsequent encounter for closed fracture with routine healing: Secondary | ICD-10-CM | POA: Diagnosis not present

## 2022-11-10 DIAGNOSIS — Z1211 Encounter for screening for malignant neoplasm of colon: Secondary | ICD-10-CM | POA: Diagnosis not present

## 2022-11-10 DIAGNOSIS — K219 Gastro-esophageal reflux disease without esophagitis: Secondary | ICD-10-CM | POA: Diagnosis not present

## 2022-11-10 DIAGNOSIS — Z01818 Encounter for other preprocedural examination: Secondary | ICD-10-CM | POA: Diagnosis not present

## 2022-11-10 DIAGNOSIS — R1013 Epigastric pain: Secondary | ICD-10-CM | POA: Diagnosis not present

## 2022-11-12 DIAGNOSIS — R1013 Epigastric pain: Secondary | ICD-10-CM | POA: Diagnosis not present

## 2022-11-12 DIAGNOSIS — S52531D Colles' fracture of right radius, subsequent encounter for closed fracture with routine healing: Secondary | ICD-10-CM | POA: Diagnosis not present

## 2022-12-13 DIAGNOSIS — S52531D Colles' fracture of right radius, subsequent encounter for closed fracture with routine healing: Secondary | ICD-10-CM | POA: Diagnosis not present

## 2023-01-26 ENCOUNTER — Other Ambulatory Visit: Payer: Self-pay | Admitting: Nurse Practitioner

## 2023-01-26 DIAGNOSIS — E039 Hypothyroidism, unspecified: Secondary | ICD-10-CM

## 2023-01-27 NOTE — Telephone Encounter (Signed)
Okay to refill. Call and schedule a follow up appointment.

## 2023-02-24 DIAGNOSIS — Z6829 Body mass index (BMI) 29.0-29.9, adult: Secondary | ICD-10-CM | POA: Diagnosis not present

## 2023-02-24 DIAGNOSIS — Z7689 Persons encountering health services in other specified circumstances: Secondary | ICD-10-CM | POA: Diagnosis not present

## 2023-02-24 DIAGNOSIS — E669 Obesity, unspecified: Secondary | ICD-10-CM | POA: Diagnosis not present

## 2023-02-24 DIAGNOSIS — E039 Hypothyroidism, unspecified: Secondary | ICD-10-CM | POA: Diagnosis not present

## 2023-02-24 DIAGNOSIS — Z13820 Encounter for screening for osteoporosis: Secondary | ICD-10-CM | POA: Diagnosis not present

## 2023-02-24 DIAGNOSIS — R7309 Other abnormal glucose: Secondary | ICD-10-CM | POA: Diagnosis not present

## 2023-02-24 DIAGNOSIS — R0789 Other chest pain: Secondary | ICD-10-CM | POA: Diagnosis not present

## 2023-02-24 DIAGNOSIS — Z0000 Encounter for general adult medical examination without abnormal findings: Secondary | ICD-10-CM | POA: Diagnosis not present

## 2023-02-24 DIAGNOSIS — R42 Dizziness and giddiness: Secondary | ICD-10-CM | POA: Diagnosis not present

## 2023-03-10 DIAGNOSIS — Z13820 Encounter for screening for osteoporosis: Secondary | ICD-10-CM | POA: Diagnosis not present

## 2023-03-10 DIAGNOSIS — R42 Dizziness and giddiness: Secondary | ICD-10-CM | POA: Diagnosis not present

## 2023-03-10 DIAGNOSIS — R519 Headache, unspecified: Secondary | ICD-10-CM | POA: Diagnosis not present

## 2023-03-10 DIAGNOSIS — R7309 Other abnormal glucose: Secondary | ICD-10-CM | POA: Diagnosis not present

## 2023-03-10 DIAGNOSIS — E039 Hypothyroidism, unspecified: Secondary | ICD-10-CM | POA: Diagnosis not present

## 2023-03-10 DIAGNOSIS — Z780 Asymptomatic menopausal state: Secondary | ICD-10-CM | POA: Diagnosis not present

## 2023-03-10 DIAGNOSIS — R0789 Other chest pain: Secondary | ICD-10-CM | POA: Diagnosis not present

## 2023-03-10 DIAGNOSIS — Z6829 Body mass index (BMI) 29.0-29.9, adult: Secondary | ICD-10-CM | POA: Diagnosis not present

## 2023-03-10 DIAGNOSIS — Z7689 Persons encountering health services in other specified circumstances: Secondary | ICD-10-CM | POA: Diagnosis not present

## 2023-03-18 DIAGNOSIS — E782 Mixed hyperlipidemia: Secondary | ICD-10-CM | POA: Diagnosis not present

## 2023-03-18 DIAGNOSIS — I517 Cardiomegaly: Secondary | ICD-10-CM | POA: Diagnosis not present

## 2023-03-18 DIAGNOSIS — R0602 Shortness of breath: Secondary | ICD-10-CM | POA: Diagnosis not present

## 2023-03-18 DIAGNOSIS — F411 Generalized anxiety disorder: Secondary | ICD-10-CM | POA: Diagnosis not present

## 2023-03-18 DIAGNOSIS — R0789 Other chest pain: Secondary | ICD-10-CM | POA: Diagnosis not present

## 2023-03-18 DIAGNOSIS — E669 Obesity, unspecified: Secondary | ICD-10-CM | POA: Diagnosis not present

## 2023-03-18 DIAGNOSIS — Z7689 Persons encountering health services in other specified circumstances: Secondary | ICD-10-CM | POA: Diagnosis not present

## 2023-03-24 DIAGNOSIS — M8588 Other specified disorders of bone density and structure, other site: Secondary | ICD-10-CM | POA: Diagnosis not present

## 2023-04-22 ENCOUNTER — Encounter: Payer: Self-pay | Admitting: *Deleted

## 2023-04-25 ENCOUNTER — Ambulatory Visit: Payer: Medicare HMO

## 2023-04-25 ENCOUNTER — Ambulatory Visit
Admission: RE | Admit: 2023-04-25 | Discharge: 2023-04-25 | Disposition: A | Discharge status: Home / Self Care | Payer: Medicare HMO | Attending: Gastroenterology | Admitting: Gastroenterology

## 2023-04-25 ENCOUNTER — Encounter: Payer: Self-pay | Admitting: *Deleted

## 2023-04-25 ENCOUNTER — Encounter
Admission: RE | Disposition: A | Discharge status: Home / Self Care | Payer: Self-pay | Source: Home / Self Care | Attending: Gastroenterology

## 2023-04-25 ENCOUNTER — Other Ambulatory Visit: Payer: Self-pay

## 2023-04-25 DIAGNOSIS — K640 First degree hemorrhoids: Secondary | ICD-10-CM | POA: Diagnosis not present

## 2023-04-25 DIAGNOSIS — K2970 Gastritis, unspecified, without bleeding: Secondary | ICD-10-CM | POA: Diagnosis not present

## 2023-04-25 DIAGNOSIS — Z90710 Acquired absence of both cervix and uterus: Secondary | ICD-10-CM | POA: Diagnosis not present

## 2023-04-25 DIAGNOSIS — K649 Unspecified hemorrhoids: Secondary | ICD-10-CM | POA: Diagnosis not present

## 2023-04-25 DIAGNOSIS — K3189 Other diseases of stomach and duodenum: Secondary | ICD-10-CM | POA: Insufficient documentation

## 2023-04-25 DIAGNOSIS — K5790 Diverticulosis of intestine, part unspecified, without perforation or abscess without bleeding: Secondary | ICD-10-CM | POA: Diagnosis not present

## 2023-04-25 DIAGNOSIS — K319 Disease of stomach and duodenum, unspecified: Secondary | ICD-10-CM | POA: Diagnosis not present

## 2023-04-25 DIAGNOSIS — K317 Polyp of stomach and duodenum: Secondary | ICD-10-CM | POA: Diagnosis not present

## 2023-04-25 DIAGNOSIS — R1013 Epigastric pain: Secondary | ICD-10-CM | POA: Diagnosis not present

## 2023-04-25 DIAGNOSIS — Z1211 Encounter for screening for malignant neoplasm of colon: Secondary | ICD-10-CM | POA: Diagnosis not present

## 2023-04-25 DIAGNOSIS — K635 Polyp of colon: Secondary | ICD-10-CM | POA: Diagnosis not present

## 2023-04-25 DIAGNOSIS — K219 Gastro-esophageal reflux disease without esophagitis: Secondary | ICD-10-CM | POA: Insufficient documentation

## 2023-04-25 DIAGNOSIS — K5730 Diverticulosis of large intestine without perforation or abscess without bleeding: Secondary | ICD-10-CM | POA: Diagnosis not present

## 2023-04-25 HISTORY — PX: ESOPHAGOGASTRODUODENOSCOPY: SHX5428

## 2023-04-25 HISTORY — PX: POLYPECTOMY: SHX5525

## 2023-04-25 HISTORY — PX: BIOPSY: SHX5522

## 2023-04-25 HISTORY — PX: COLONOSCOPY WITH PROPOFOL: SHX5780

## 2023-04-25 SURGERY — COLONOSCOPY WITH PROPOFOL
Anesthesia: General

## 2023-04-25 MED ORDER — PROPOFOL 10 MG/ML IV BOLUS
INTRAVENOUS | Status: AC
  Filled 2023-04-25: qty 40

## 2023-04-25 MED ORDER — SODIUM CHLORIDE 0.9 % IV SOLN: INTRAVENOUS | Status: DC

## 2023-04-25 MED ORDER — PROPOFOL 500 MG/50ML IV EMUL
INTRAVENOUS | Status: DC | PRN
  Administered 2023-04-25: 150 ug/kg/min via INTRAVENOUS

## 2023-04-25 MED ORDER — PROPOFOL 10 MG/ML IV BOLUS
INTRAVENOUS | Status: DC | PRN
  Administered 2023-04-25: 75 mg via INTRAVENOUS

## 2023-04-25 MED ORDER — STERILE WATER FOR IRRIGATION IR SOLN
Status: DC | PRN
  Administered 2023-04-25: 60 mL

## 2023-04-25 MED ORDER — LIDOCAINE HCL (PF) 2 % IJ SOLN
INTRAMUSCULAR | Status: AC
  Filled 2023-04-25: qty 5

## 2023-04-25 MED ORDER — LIDOCAINE HCL (CARDIAC) PF 100 MG/5ML IV SOSY
PREFILLED_SYRINGE | INTRAVENOUS | Status: DC | PRN
  Administered 2023-04-25: 50 mg via INTRAVENOUS

## 2023-04-25 NOTE — Op Note (Signed)
[facility] Gastroenterology Patient Name: Ashley Goodman Procedure Date: 04/25/2023 11:32 AM MRN: [id number] Account #: [id number] Date of Birth: 1956-05-30 Admit Type: Outpatient Age: 67 Room: [facility] ENDO ROOM 3 Gender: Female Note Status: Finalized Instrument Name: Prentice Docker [id number] Procedure:             Colonoscopy Indications:           Screening for colorectal malignant neoplasm Providers:             Eather Colas MD, MD Referring MD:          Kara Dies (Referring MD) Medicines:             Monitored Anesthesia Care Complications:         No immediate complications. Estimated blood loss:                         Minimal. Procedure:             Pre-Anesthesia Assessment:                        - Prior to the procedure, a History and Physical was                         performed, and patient medications and allergies were                         reviewed. The patient is competent. The risks and                         benefits of the procedure and the sedation options and                         risks were discussed with the patient. All questions                         were answered and informed consent was obtained.                         Patient identification and proposed procedure were                         verified by the physician, the nurse, the                         anesthesiologist, the anesthetist and the technician                         in the endoscopy suite. Mental Status Examination:                         alert and oriented. Airway Examination: normal                         oropharyngeal airway and neck mobility. Respiratory                         Examination: clear to auscultation. CV Examination:  normal. Prophylactic Antibiotics: The patient does not                         require prophylactic antibiotics. Prior                         Anticoagulants: The patient has taken no anticoagulant                          or antiplatelet agents. ASA Grade Assessment: II - A                         patient with mild systemic disease. After reviewing                         the risks and benefits, the patient was deemed in                         satisfactory condition to undergo the procedure. The                         anesthesia plan was to use monitored anesthesia care                         (MAC). Immediately prior to administration of                         medications, the patient was re-assessed for adequacy                         to receive sedatives. The heart rate, respiratory                         rate, oxygen saturations, blood pressure, adequacy of                         pulmonary ventilation, and response to care were                         monitored throughout the procedure. The physical                         status of the patient was re-assessed after the                         procedure.                        After obtaining informed consent, the colonoscope was                         passed under direct vision. Throughout the procedure,                         the patient's blood pressure, pulse, and oxygen                         saturations were monitored continuously. The  Colonoscope was introduced through the anus and                         advanced to the the cecum, identified by appendiceal                         orifice and ileocecal valve. The colonoscopy was                         performed without difficulty. The patient tolerated                         the procedure well. The quality of the bowel                         preparation was good. The ileocecal valve, appendiceal                         orifice, and rectum were photographed. Findings:      The perianal and digital rectal examinations were normal.      A 1 mm polyp was found in the hepatic flexure. The polyp was sessile.       The polyp was removed with a  jumbo cold forceps. Resection and retrieval       were complete. Estimated blood loss was minimal.      Multiple small-mouthed diverticula were found in the sigmoid colon.      Internal hemorrhoids were found during retroflexion. The hemorrhoids       were Grade I (internal hemorrhoids that do not prolapse).      The exam was otherwise without abnormality on direct and retroflexion       views. Impression:            - One 1 mm polyp at the hepatic flexure, removed with                         a jumbo cold forceps. Resected and retrieved.                        - Diverticulosis in the sigmoid colon.                        - Internal hemorrhoids.                        - The examination was otherwise normal on direct and                         retroflexion views. Recommendation:        - Discharge patient to home.                        - Resume previous diet.                        - Continue present medications.                        - Await pathology results.                        -  Repeat colonoscopy in 7-10 years for surveillance.                        - Return to referring physician as previously                         scheduled. Procedure Code(s):     --- Professional ---                        [phone], Colonoscopy, flexible; with biopsy, single or                         multiple Diagnosis Code(s):     --- Professional ---                        Z12.11, Encounter for screening for malignant neoplasm                         of colon                        D12.3, Benign neoplasm of transverse colon (hepatic                         flexure or splenic flexure)                        K64.0, First degree hemorrhoids                        K57.30, Diverticulosis of large intestine without                         perforation or abscess without bleeding CPT copyright 2022 American Medical Association. All rights reserved. The codes documented in this report are preliminary and upon  coder review may  be revised to meet current compliance requirements. Eather Colas MD, MD 04/25/2023 12:55:14 PM Number of Addenda: 0 Note Initiated On: 04/25/2023 11:32 AM Scope Withdrawal Time: 0 hours 9 minutes 9 seconds  Total Procedure Duration: 0 hours 16 minutes 5 seconds  Estimated Blood Loss:  Estimated blood loss was minimal.      [facility]

## 2023-04-25 NOTE — Anesthesia Postprocedure Evaluation (Signed)
Anesthesia Post Note  Patient: Ashley Goodman  Procedure(s) Performed: COLONOSCOPY WITH PROPOFOL ESOPHAGOGASTRODUODENOSCOPY (EGD) BIOPSY POLYPECTOMY  Patient location during evaluation: PACU Anesthesia Type: General Level of consciousness: awake Pain management: satisfactory to patient Vital Signs Assessment: post-procedure vital signs reviewed and stable Respiratory status: spontaneous breathing and nonlabored ventilation Cardiovascular status: blood pressure returned to baseline Anesthetic complications: no   No notable events documented.   Last Vitals:  Vitals:   04/25/23 1250 04/25/23 1301  BP: (!) 67/63 127/80  Pulse: 62 (!) 58  Resp: 16 15  Temp: (!) 35.9 C   SpO2: 99% 100%    Last Pain:  Vitals:   04/25/23 1250  TempSrc: Temporal  PainSc: 0-No pain                 VAN STAVEREN,Kareen Jefferys

## 2023-04-25 NOTE — Op Note (Signed)
[facility] Gastroenterology Patient Name: Ashley Goodman Procedure Date: 04/25/2023 12:05 PM MRN: [id number] Account #: [id number] Date of Birth: August 10, 1956 Admit Type: Outpatient Age: 67 Room: [facility] ENDO ROOM 3 Gender: Female Note Status: Finalized Instrument Name: Upper Endoscope [id number] Procedure:             Upper GI endoscopy Indications:           Epigastric abdominal pain, Gastro-esophageal reflux                         disease Providers:             Eather Colas MD, MD Referring MD:          Kara Dies (Referring MD) Medicines:             Monitored Anesthesia Care Complications:         No immediate complications. Estimated blood loss:                         Minimal. Procedure:             Pre-Anesthesia Assessment:                        - Prior to the procedure, a History and Physical was                         performed, and patient medications and allergies were                         reviewed. The patient is competent. The risks and                         benefits of the procedure and the sedation options and                         risks were discussed with the patient. All questions                         were answered and informed consent was obtained.                         Patient identification and proposed procedure were                         verified by the physician, the nurse, the                         anesthesiologist, the anesthetist and the technician                         in the endoscopy suite. Mental Status Examination:                         alert and oriented. Airway Examination: normal                         oropharyngeal airway and neck mobility. Respiratory  Examination: clear to auscultation. CV Examination:                         normal. Prophylactic Antibiotics: The patient does not                         require prophylactic antibiotics. Prior                          Anticoagulants: The patient has taken no anticoagulant                         or antiplatelet agents. ASA Grade Assessment: II - A                         patient with mild systemic disease. After reviewing                         the risks and benefits, the patient was deemed in                         satisfactory condition to undergo the procedure. The                         anesthesia plan was to use monitored anesthesia care                         (MAC). Immediately prior to administration of                         medications, the patient was re-assessed for adequacy                         to receive sedatives. The heart rate, respiratory                         rate, oxygen saturations, blood pressure, adequacy of                         pulmonary ventilation, and response to care were                         monitored throughout the procedure. The physical                         status of the patient was re-assessed after the                         procedure.                        After obtaining informed consent, the endoscope was                         passed under direct vision. Throughout the procedure,                         the patient's blood pressure, pulse, and oxygen  saturations were monitored continuously. The Endoscope                         was introduced through the mouth, and advanced to the                         second part of duodenum. The upper GI endoscopy was                         accomplished without difficulty. The patient tolerated                         the procedure well. Findings:      The examined esophagus was normal.      A single 12 mm papule (nodule) with no bleeding and no stigmata of       recent bleeding was found in the gastric antrum. The nodule was Paris       classification Is (protruding, sessile). Biopsies were taken with a cold       forceps for histology. Estimated blood loss was minimal.      Patchy  mild inflammation characterized by erythema was found in the       gastric antrum. Biopsies were taken with a cold forceps for Helicobacter       pylori testing. Estimated blood loss was minimal.      The examined duodenum was normal. Impression:            - Normal esophagus.                        - A single papule (nodule) found in the stomach.                         Biopsied.                        - Gastritis. Biopsied.                        - Normal examined duodenum. Recommendation:        - Discharge patient to home.                        - Resume previous diet.                        - Continue present medications.                        - Await pathology results.                        - Return to referring physician as previously                         scheduled. Procedure Code(s):     --- Professional ---                        [phone], Esophagogastroduodenoscopy, flexible,                         transoral; with biopsy, single or  multiple Diagnosis Code(s):     --- Professional ---                        K31.89, Other diseases of stomach and duodenum                        K29.70, Gastritis, unspecified, without bleeding                        R10.13, Epigastric pain                        K21.9, Gastro-esophageal reflux disease without                         esophagitis CPT copyright 2022 American Medical Association. All rights reserved. The codes documented in this report are preliminary and upon coder review may  be revised to meet current compliance requirements. Eather Colas MD, MD 04/25/2023 12:52:34 PM Number of Addenda: 0 Note Initiated On: 04/25/2023 12:05 PM Estimated Blood Loss:  Estimated blood loss was minimal.      [facility]

## 2023-04-25 NOTE — H&P (Signed)
Outpatient short stay form Pre-procedure 04/25/2023  Regis Bill, MD  Primary Physician: Kara Dies, NP  Reason for visit:  GERD/Colon cancer screening  History of present illness:    67 y/o lady with history of hypothyroidism here for EGD/Colonoscopy for GERD and colon cancer screening. No blood thinners. No family history of GI malignancies. History of hysterectomy.    Current Facility-Administered Medications:    0.9 %  sodium chloride infusion, , Intravenous, Continuous, Brailee Riede, Rossie Muskrat, MD  Medications Prior to Admission  Medication Sig Dispense Refill Last Dose   hydrOXYzine (ATARAX) 25 MG tablet Take 25 mg by mouth at bedtime as needed.   04/24/2023   levothyroxine (SYNTHROID) 25 MCG tablet TAKE 1 TABLET BY MOUTH BEFORE BREAKFAST (30 MINUTES BEFORE FOOD) 90 tablet 3 04/24/2023   meloxicam (MOBIC) 15 MG tablet Take 1 tablet by mouth every day 30 tablet 0 04/24/2023   methocarbamol (ROBAXIN) 500 MG tablet TAKE 1 TABLET BY MOUTH EVERY NIGHT AT BEDTIME 30 tablet 1 04/24/2023     No Known Allergies   Past Medical History:  Diagnosis Date   Hyperlipidemia    Thyroid cyst    Thyroid disease     Review of systems:  Otherwise negative.    Physical Exam  Gen: Alert, oriented. Appears stated age.  HEENT: PERRLA. Lungs: No respiratory distress CV: RRR Abd: soft, benign, no masses Ext: No edema    Planned procedures: Proceed with EGD/colonoscopy. The patient understands the nature of the planned procedure, indications, risks, alternatives and potential complications including but not limited to bleeding, infection, perforation, damage to internal organs and possible oversedation/side effects from anesthesia. The patient agrees and gives consent to proceed.  Please refer to procedure notes for findings, recommendations and patient disposition/instructions.     Regis Bill, MD [facility] Gastroenterology

## 2023-04-25 NOTE — Transfer of Care (Signed)
Immediate Anesthesia Transfer of Care Note  Patient: Ashley Goodman  Procedure(s) Performed: COLONOSCOPY WITH PROPOFOL ESOPHAGOGASTRODUODENOSCOPY (EGD) BIOPSY POLYPECTOMY  Patient Location: PACU  Anesthesia Type:MAC  Level of Consciousness: awake  Airway & Oxygen Therapy: Patient Spontanous Breathing  Post-op Assessment: Report given to RN and Post -op Vital signs reviewed and stable  Post vital signs: Reviewed and stable  Last Vitals:  Vitals Value Taken Time  BP 97/63 04/25/23 1251  Temp    Pulse 64 04/25/23 1251  Resp 15 04/25/23 1251  SpO2 99 % 04/25/23 1251  Vitals shown include unfiled device data.  Last Pain:  Vitals:   04/25/23 1143  TempSrc: Temporal  PainSc: 0-No pain         Complications: No notable events documented.

## 2023-04-25 NOTE — Anesthesia Preprocedure Evaluation (Signed)
Anesthesia Evaluation  Patient identified by MRN, date of birth, ID band Patient awake    Reviewed: Allergy & Precautions, NPO status , Patient's Chart, lab work & pertinent test results  Airway Mallampati: III  TM Distance: >3 FB Neck ROM: full    Dental  (+) Teeth Intact   Pulmonary neg pulmonary ROS, shortness of breath   Pulmonary exam normal breath sounds clear to auscultation       Cardiovascular Exercise Tolerance: Good negative cardio ROS Normal cardiovascular exam Rhythm:Regular Rate:Normal     Neuro/Psych  PSYCHIATRIC DISORDERS Anxiety      Neuromuscular disease negative neurological ROS  negative psych ROS   GI/Hepatic negative GI ROS, Neg liver ROS,,,  Endo/Other  negative endocrine ROS    Renal/GU negative Renal ROS  negative genitourinary   Musculoskeletal  (+) Arthritis ,    Abdominal Normal abdominal exam  (+)   Peds negative pediatric ROS (+)  Hematology negative hematology ROS (+)   Anesthesia Other Findings Past Medical History: No date: Hyperlipidemia No date: Thyroid cyst No date: Thyroid disease  Past Surgical History: 2004: BREAST BIOPSY; Left     Comment:  neg 06/22/2012: BREAST EXCISIONAL BIOPSY; Right     Comment:  neg/re-excision 01/03/2012: BREAST EXCISIONAL BIOPSY; Right     Comment:  neg phyllodes tumor w/ margin involvment 09/21/2012: colonoscopy     Comment:  due for repeat 09/21/2022  BMI    Body Mass Index: 27.73 kg/m      Reproductive/Obstetrics negative OB ROS                             Anesthesia Physical Anesthesia Plan  ASA: 2  Anesthesia Plan: General   Post-op Pain Management:    Induction: Intravenous  PONV Risk Score and Plan: Propofol infusion and TIVA  Airway Management Planned: Natural Airway  Additional Equipment:   Intra-op Plan:   Post-operative Plan:   Informed Consent: I have reviewed the patients History  and Physical, chart, labs and discussed the procedure including the risks, benefits and alternatives for the proposed anesthesia with the patient or authorized representative who has indicated his/her understanding and acceptance.     Dental Advisory Given  Plan Discussed with: CRNA and Surgeon  Anesthesia Plan Comments:        Anesthesia Quick Evaluation

## 2023-04-25 NOTE — Interval H&P Note (Signed)
History and Physical Interval Note:  04/25/2023 12:13 PM  Ashley Goodman  has presented today for surgery, with the diagnosis of colon cancer screening epigastric pain.  The various methods of treatment have been discussed with the patient and family. After consideration of risks, benefits and other options for treatment, the patient has consented to  Procedure(s) with comments: COLONOSCOPY WITH PROPOFOL (N/A) - SPANISH INTERPRETER ESOPHAGOGASTRODUODENOSCOPY (EGD) (N/A) as a surgical intervention.  The patient's history has been reviewed, patient examined, no change in status, stable for surgery.  I have reviewed the patient's chart and labs.  Questions were answered to the patient's satisfaction.     Regis Bill  Ok to proceed with EGD/Colonoscopy

## 2023-04-26 ENCOUNTER — Encounter: Payer: Self-pay | Admitting: Gastroenterology

## 2023-04-28 DIAGNOSIS — Z135 Encounter for screening for eye and ear disorders: Secondary | ICD-10-CM | POA: Diagnosis not present

## 2023-04-28 DIAGNOSIS — H5203 Hypermetropia, bilateral: Secondary | ICD-10-CM | POA: Diagnosis not present

## 2023-04-28 DIAGNOSIS — H43812 Vitreous degeneration, left eye: Secondary | ICD-10-CM | POA: Diagnosis not present

## 2023-04-28 DIAGNOSIS — H2513 Age-related nuclear cataract, bilateral: Secondary | ICD-10-CM | POA: Diagnosis not present

## 2023-04-28 DIAGNOSIS — H02409 Unspecified ptosis of unspecified eyelid: Secondary | ICD-10-CM | POA: Diagnosis not present

## 2023-04-28 DIAGNOSIS — H52223 Regular astigmatism, bilateral: Secondary | ICD-10-CM | POA: Diagnosis not present

## 2023-04-28 DIAGNOSIS — H524 Presbyopia: Secondary | ICD-10-CM | POA: Diagnosis not present

## 2023-08-08 ENCOUNTER — Other Ambulatory Visit: Payer: Self-pay | Admitting: Internal Medicine

## 2023-08-08 DIAGNOSIS — Z1231 Encounter for screening mammogram for malignant neoplasm of breast: Secondary | ICD-10-CM

## 2023-08-10 DIAGNOSIS — D1722 Benign lipomatous neoplasm of skin and subcutaneous tissue of left arm: Secondary | ICD-10-CM | POA: Diagnosis not present

## 2023-08-11 DIAGNOSIS — Z0100 Encounter for examination of eyes and vision without abnormal findings: Secondary | ICD-10-CM | POA: Diagnosis not present

## 2023-08-15 DIAGNOSIS — D1722 Benign lipomatous neoplasm of skin and subcutaneous tissue of left arm: Secondary | ICD-10-CM | POA: Diagnosis not present

## 2023-08-15 DIAGNOSIS — D1721 Benign lipomatous neoplasm of skin and subcutaneous tissue of right arm: Secondary | ICD-10-CM | POA: Diagnosis not present

## 2023-08-23 ENCOUNTER — Ambulatory Visit
Admission: RE | Admit: 2023-08-23 | Discharge: 2023-08-23 | Disposition: A | Discharge status: Home / Self Care | Payer: Medicare HMO | Source: Ambulatory Visit | Attending: Internal Medicine | Admitting: Internal Medicine

## 2023-08-23 DIAGNOSIS — Z1231 Encounter for screening mammogram for malignant neoplasm of breast: Secondary | ICD-10-CM | POA: Diagnosis not present

## 2023-09-12 DIAGNOSIS — M542 Cervicalgia: Secondary | ICD-10-CM | POA: Diagnosis not present

## 2023-09-12 DIAGNOSIS — M9901 Segmental and somatic dysfunction of cervical region: Secondary | ICD-10-CM | POA: Diagnosis not present

## 2023-09-12 DIAGNOSIS — R519 Headache, unspecified: Secondary | ICD-10-CM | POA: Diagnosis not present

## 2023-09-12 DIAGNOSIS — M9902 Segmental and somatic dysfunction of thoracic region: Secondary | ICD-10-CM | POA: Diagnosis not present

## 2023-09-13 DIAGNOSIS — M9902 Segmental and somatic dysfunction of thoracic region: Secondary | ICD-10-CM | POA: Diagnosis not present

## 2023-09-13 DIAGNOSIS — M9901 Segmental and somatic dysfunction of cervical region: Secondary | ICD-10-CM | POA: Diagnosis not present

## 2023-09-13 DIAGNOSIS — R519 Headache, unspecified: Secondary | ICD-10-CM | POA: Diagnosis not present

## 2023-09-13 DIAGNOSIS — M542 Cervicalgia: Secondary | ICD-10-CM | POA: Diagnosis not present

## 2023-09-16 DIAGNOSIS — M542 Cervicalgia: Secondary | ICD-10-CM | POA: Diagnosis not present

## 2023-09-16 DIAGNOSIS — M9901 Segmental and somatic dysfunction of cervical region: Secondary | ICD-10-CM | POA: Diagnosis not present

## 2023-09-16 DIAGNOSIS — R519 Headache, unspecified: Secondary | ICD-10-CM | POA: Diagnosis not present

## 2023-09-16 DIAGNOSIS — M9902 Segmental and somatic dysfunction of thoracic region: Secondary | ICD-10-CM | POA: Diagnosis not present

## 2023-09-19 DIAGNOSIS — R519 Headache, unspecified: Secondary | ICD-10-CM | POA: Diagnosis not present

## 2023-09-19 DIAGNOSIS — M9902 Segmental and somatic dysfunction of thoracic region: Secondary | ICD-10-CM | POA: Diagnosis not present

## 2023-09-19 DIAGNOSIS — M9901 Segmental and somatic dysfunction of cervical region: Secondary | ICD-10-CM | POA: Diagnosis not present

## 2023-09-19 DIAGNOSIS — M542 Cervicalgia: Secondary | ICD-10-CM | POA: Diagnosis not present

## 2023-09-27 DIAGNOSIS — Z03818 Encounter for observation for suspected exposure to other biological agents ruled out: Secondary | ICD-10-CM | POA: Diagnosis not present

## 2023-09-27 DIAGNOSIS — J205 Acute bronchitis due to respiratory syncytial virus: Secondary | ICD-10-CM | POA: Diagnosis not present

## 2024-04-18 DIAGNOSIS — E039 Hypothyroidism, unspecified: Secondary | ICD-10-CM | POA: Diagnosis not present

## 2024-04-18 DIAGNOSIS — F411 Generalized anxiety disorder: Secondary | ICD-10-CM | POA: Diagnosis not present

## 2024-04-18 DIAGNOSIS — M8589 Other specified disorders of bone density and structure, multiple sites: Secondary | ICD-10-CM | POA: Diagnosis not present

## 2024-04-18 DIAGNOSIS — M25531 Pain in right wrist: Secondary | ICD-10-CM | POA: Diagnosis not present

## 2024-04-18 DIAGNOSIS — E7800 Pure hypercholesterolemia, unspecified: Secondary | ICD-10-CM | POA: Diagnosis not present

## 2024-04-18 DIAGNOSIS — Z6829 Body mass index (BMI) 29.0-29.9, adult: Secondary | ICD-10-CM | POA: Diagnosis not present

## 2024-04-24 DIAGNOSIS — Z0000 Encounter for general adult medical examination without abnormal findings: Secondary | ICD-10-CM | POA: Diagnosis not present

## 2024-04-24 DIAGNOSIS — E039 Hypothyroidism, unspecified: Secondary | ICD-10-CM | POA: Diagnosis not present

## 2024-04-24 DIAGNOSIS — D1722 Benign lipomatous neoplasm of skin and subcutaneous tissue of left arm: Secondary | ICD-10-CM | POA: Diagnosis not present

## 2024-04-24 DIAGNOSIS — Z1331 Encounter for screening for depression: Secondary | ICD-10-CM | POA: Diagnosis not present

## 2024-04-24 DIAGNOSIS — J209 Acute bronchitis, unspecified: Secondary | ICD-10-CM | POA: Diagnosis not present

## 2024-04-24 DIAGNOSIS — R519 Headache, unspecified: Secondary | ICD-10-CM | POA: Diagnosis not present

## 2024-04-24 DIAGNOSIS — M8580 Other specified disorders of bone density and structure, unspecified site: Secondary | ICD-10-CM | POA: Diagnosis not present

## 2024-04-24 DIAGNOSIS — H6091 Unspecified otitis externa, right ear: Secondary | ICD-10-CM | POA: Diagnosis not present

## 2024-06-06 DIAGNOSIS — D1720 Benign lipomatous neoplasm of skin and subcutaneous tissue of unspecified limb: Secondary | ICD-10-CM | POA: Diagnosis not present

## 2024-06-13 DIAGNOSIS — H90A21 Sensorineural hearing loss, unilateral, right ear, with restricted hearing on the contralateral side: Secondary | ICD-10-CM | POA: Diagnosis not present

## 2024-06-13 DIAGNOSIS — J301 Allergic rhinitis due to pollen: Secondary | ICD-10-CM | POA: Diagnosis not present

## 2024-06-13 DIAGNOSIS — H93A1 Pulsatile tinnitus, right ear: Secondary | ICD-10-CM | POA: Diagnosis not present

## 2024-06-13 DIAGNOSIS — R070 Pain in throat: Secondary | ICD-10-CM | POA: Diagnosis not present

## 2024-06-13 DIAGNOSIS — L299 Pruritus, unspecified: Secondary | ICD-10-CM | POA: Diagnosis not present

## 2024-06-13 DIAGNOSIS — H9041 Sensorineural hearing loss, unilateral, right ear, with unrestricted hearing on the contralateral side: Secondary | ICD-10-CM | POA: Diagnosis not present

## 2024-06-13 DIAGNOSIS — H6981 Other specified disorders of Eustachian tube, right ear: Secondary | ICD-10-CM | POA: Diagnosis not present

## 2024-06-27 DIAGNOSIS — M25562 Pain in left knee: Secondary | ICD-10-CM | POA: Diagnosis not present

## 2024-06-27 DIAGNOSIS — S83242D Other tear of medial meniscus, current injury, left knee, subsequent encounter: Secondary | ICD-10-CM | POA: Diagnosis not present

## 2024-06-27 DIAGNOSIS — M170 Bilateral primary osteoarthritis of knee: Secondary | ICD-10-CM | POA: Diagnosis not present

## 2024-10-12 ENCOUNTER — Other Ambulatory Visit: Payer: Self-pay | Admitting: Internal Medicine

## 2024-10-12 DIAGNOSIS — Z1231 Encounter for screening mammogram for malignant neoplasm of breast: Secondary | ICD-10-CM

## 2024-11-07 ENCOUNTER — Encounter
